# Patient Record
Sex: Female | Born: 1950 | Race: Asian | Hispanic: No | Smoking: Never smoker
Health system: Southern US, Community
[De-identification: ages and names within clinical notes are randomized; demographics above are authoritative.]

## PROBLEM LIST (undated history)

## (undated) DIAGNOSIS — M542 Cervicalgia: Secondary | ICD-10-CM

## (undated) DIAGNOSIS — R5383 Other fatigue: Secondary | ICD-10-CM

## (undated) DIAGNOSIS — R413 Other amnesia: Secondary | ICD-10-CM

## (undated) DIAGNOSIS — I10 Essential (primary) hypertension: Secondary | ICD-10-CM

## (undated) DIAGNOSIS — R51 Headache: Secondary | ICD-10-CM

## (undated) DIAGNOSIS — M7918 Myalgia, other site: Secondary | ICD-10-CM

## (undated) DIAGNOSIS — R519 Headache, unspecified: Secondary | ICD-10-CM

## (undated) HISTORY — DX: Myalgia, other site: M79.18

## (undated) HISTORY — DX: Other fatigue: R53.83

## (undated) HISTORY — DX: Other amnesia: R41.3

## (undated) HISTORY — DX: Essential (primary) hypertension: I10

## (undated) HISTORY — DX: Headache: R51

## (undated) HISTORY — DX: Cervicalgia: M54.2

## (undated) HISTORY — PX: OTHER SURGICAL HISTORY: SHX169

## (undated) HISTORY — DX: Headache, unspecified: R51.9

---

## 2002-03-04 ENCOUNTER — Inpatient Hospital Stay (HOSPITAL_COMMUNITY): Admission: AD | Admit: 2002-03-04 | Discharge: 2002-03-04 | Payer: Self-pay | Admitting: *Deleted

## 2002-03-07 ENCOUNTER — Encounter: Admission: RE | Admit: 2002-03-07 | Discharge: 2002-03-07 | Payer: Self-pay | Admitting: Obstetrics and Gynecology

## 2002-04-30 ENCOUNTER — Encounter: Admission: RE | Admit: 2002-04-30 | Discharge: 2002-04-30 | Payer: Self-pay | Admitting: *Deleted

## 2002-08-19 ENCOUNTER — Emergency Department (HOSPITAL_COMMUNITY): Admission: EM | Admit: 2002-08-19 | Discharge: 2002-08-19 | Payer: Self-pay | Admitting: Emergency Medicine

## 2004-06-25 ENCOUNTER — Ambulatory Visit (HOSPITAL_BASED_OUTPATIENT_CLINIC_OR_DEPARTMENT_OTHER): Admission: RE | Admit: 2004-06-25 | Discharge: 2004-06-25 | Payer: Self-pay | Admitting: Otolaryngology

## 2009-04-26 ENCOUNTER — Emergency Department (HOSPITAL_COMMUNITY): Admission: EM | Admit: 2009-04-26 | Discharge: 2009-04-26 | Payer: Self-pay | Admitting: Emergency Medicine

## 2011-05-13 NOTE — Op Note (Signed)
NAME:  Jo Jenkins                              ACCOUNT NO.:  [id number]   MEDICAL RECORD NO.:  [id number]                   PATIENT TYPE:  AMB   LOCATION:  DSC                                  FACILITY:  [facility]   PHYSICIAN:  Christopher E. Ezzard Standing, M.D.         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   OPERATION:  Left medial graft tympanoplasty.   SURGEON:  Kristine Garbe. Ezzard Standing, M.D.   ANESTHESIA:  General endotracheal.   COMPLICATIONS:  None.   BRIEF CLINICAL NOTE:  [name] is a 60 year old [place] lady who  presented with decreased hearing and has a large central 30-35% TM  perforation.  It is dry without drainage.  She has a mild conductive hearing  loss on hearing screen with the tuning forks.  She is taken to the operating  room at this time for a left tympanoplasty.   DESCRIPTION OF PROCEDURE:  After adequate endotracheal anesthesia, the  patient received 1 g Ancef IV preoperatively.  The ear was prepped with  Betadine solution and draped out with sterile towels.  The ear canal was  then injected with Xylocaine with epinephrine for hemostasis and the  postauricular area was likewise injected.  A posterior-based tympanomeatal  flap was elevated down to the annulus.  Cotton pledgets soaked in adrenalin  were then placed for hemostasis.  During this time a temporalis fascia graft  was harvested from a postauricular incision and set aside to dry.  The  postauricular incision was closed with 3-0 chromic suture subcutaneously and  4-0 nylon through the skin.  Next attention was carried back to the ear  canal.  The annulus was elevated.  The middle ear space was entered.  There  was  some thickened middle ear mucosa inferiorly but no drainage.  The edges  of the perforation were freshened up prior to elevating the tympanomeatal  flap with cup forceps and a straight pick.  After elevating the  tympanomeatal flap, the incus and stapes could be visualized and the incus  and stapes were mobile.  There was no significant middle ear disease noted.  The temporalis fascia graft was cut to appropriate size, was placed medial  to the tympanomeatal flap and medial to the perforation.  The middle ear  space was then packed with Gelfoam soaked in colimycin.  The tympanomeatal  flap was brought back down along with the fascial graft, and the fascial  graft covered the entire perforation.  The ear canal was then packed with  Gelfoam soaked in colimycin.  This completed the procedure.  A cotton ball  was placed in the ear canal, followed by a  mastoid dressing.  The patient  was awoken from anesthesia and transferred to the recovery room postop doing  well.   DISPOSITION:  [name] is discharged home later this morning on Tylenol and  Tylenol No. 3 p.r.n. pain, Keflex 500 mg b.i.d. for four days.  We will have  her follow up in my office in one week for recheck and to remove  postauricular sutures.  She is instructed to remove the mastoid dressing in  the morning.                                              Kristine Garbe. Ezzard Standing, M.D.   CEN/MEDQ  D:  06/25/2004  T:  06/26/2004  Job:  [id number]

## 2014-06-19 ENCOUNTER — Emergency Department (HOSPITAL_COMMUNITY)
Admission: EM | Admit: 2014-06-19 | Discharge: 2014-06-19 | Disposition: A | Discharge status: Home / Self Care | Payer: No Typology Code available for payment source | Attending: Emergency Medicine | Admitting: Emergency Medicine

## 2014-06-19 ENCOUNTER — Ambulatory Visit: Payer: Self-pay

## 2014-06-19 ENCOUNTER — Ambulatory Visit (INDEPENDENT_AMBULATORY_CARE_PROVIDER_SITE_OTHER): Payer: Self-pay | Admitting: Family Medicine

## 2014-06-19 ENCOUNTER — Encounter (HOSPITAL_COMMUNITY): Payer: Self-pay | Admitting: Emergency Medicine

## 2014-06-19 DIAGNOSIS — M79604 Pain in right leg: Secondary | ICD-10-CM

## 2014-06-19 DIAGNOSIS — M79609 Pain in unspecified limb: Secondary | ICD-10-CM | POA: Insufficient documentation

## 2014-06-19 DIAGNOSIS — S8011XA Contusion of right lower leg, initial encounter: Secondary | ICD-10-CM

## 2014-06-19 DIAGNOSIS — S20219S Contusion of unspecified front wall of thorax, sequela: Secondary | ICD-10-CM

## 2014-06-19 DIAGNOSIS — S8010XA Contusion of unspecified lower leg, initial encounter: Secondary | ICD-10-CM

## 2014-06-19 DIAGNOSIS — IMO0002 Reserved for concepts with insufficient information to code with codable children: Secondary | ICD-10-CM

## 2014-06-19 LAB — I-STAT CHEM 8, ED
BUN: 13 mg/dL (ref 6–23)
CHLORIDE: 101 meq/L (ref 96–112)
Calcium, Ion: 1.19 mmol/L (ref 1.13–1.30)
Creatinine, Ser: 0.6 mg/dL (ref 0.50–1.10)
GLUCOSE: 118 mg/dL — AB (ref 70–99)
HEMATOCRIT: 41 % (ref 36.0–46.0)
Hemoglobin: 13.9 g/dL (ref 12.0–15.0)
POTASSIUM: 3.6 meq/L — AB (ref 3.7–5.3)
Sodium: 143 mEq/L (ref 137–147)
TCO2: 25 mmol/L (ref 0–100)

## 2014-06-19 MED ORDER — HYDROCODONE-ACETAMINOPHEN 5-325 MG PO TABS: 1.0000 | ORAL_TABLET | Freq: Four times a day (QID) | ORAL | Status: DC | PRN

## 2014-06-19 MED ORDER — ENOXAPARIN SODIUM 80 MG/0.8ML ~~LOC~~ SOLN
1.0000 mg/kg | Freq: Once | SUBCUTANEOUS | Status: AC
  Administered 2014-06-19: 65 mg via SUBCUTANEOUS
  Filled 2014-06-19: qty 0.8

## 2014-06-19 MED ORDER — ENOXAPARIN SODIUM 100 MG/ML ~~LOC~~ SOLN
1.0000 mg/kg | Freq: Once | SUBCUTANEOUS | Status: DC
  Filled 2014-06-19 (×2): qty 1

## 2014-06-19 MED ORDER — HYDROCODONE-ACETAMINOPHEN 5-325 MG PO TABS
1.0000 | ORAL_TABLET | Freq: Once | ORAL | Status: AC
  Administered 2014-06-19: 1 via ORAL
  Filled 2014-06-19: qty 1
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
## 2014-06-19 NOTE — ED Notes (Signed)
Pt A&OX4, wheeled out of ED via wheelchair, NAD. 

## 2014-06-19 NOTE — Patient Instructions (Signed)
Go to the [facility] emergency room. I spoke to the emergency room physician who knows that she is coming. I am concerned she could have a compartment syndrome and they need to do some further testing to determine whether this is the case or not.

## 2014-06-19 NOTE — ED Notes (Signed)
Dr. Plunkett at bedside.  
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
## 2014-06-20 ENCOUNTER — Encounter (HOSPITAL_COMMUNITY): Payer: Self-pay | Admitting: Emergency Medicine

## 2014-06-20 ENCOUNTER — Emergency Department (HOSPITAL_COMMUNITY)
Admission: EM | Admit: 2014-06-20 | Discharge: 2014-06-20 | Disposition: A | Discharge status: Home / Self Care | Payer: No Typology Code available for payment source | Attending: Emergency Medicine | Admitting: Emergency Medicine

## 2014-06-20 ENCOUNTER — Ambulatory Visit (HOSPITAL_COMMUNITY)
Admission: RE | Admit: 2014-06-20 | Discharge: 2014-06-20 | Disposition: A | Discharge status: Home / Self Care | Payer: No Typology Code available for payment source | Source: Ambulatory Visit

## 2014-06-20 DIAGNOSIS — T50905A Adverse effect of unspecified drugs, medicaments and biological substances, initial encounter: Secondary | ICD-10-CM

## 2014-06-20 DIAGNOSIS — M79609 Pain in unspecified limb: Secondary | ICD-10-CM

## 2014-06-20 DIAGNOSIS — T4275XA Adverse effect of unspecified antiepileptic and sedative-hypnotic drugs, initial encounter: Secondary | ICD-10-CM | POA: Insufficient documentation

## 2014-06-20 DIAGNOSIS — M7989 Other specified soft tissue disorders: Secondary | ICD-10-CM

## 2014-06-20 DIAGNOSIS — R42 Dizziness and giddiness: Secondary | ICD-10-CM | POA: Insufficient documentation

## 2014-06-20 DIAGNOSIS — M79604 Pain in right leg: Secondary | ICD-10-CM

## 2014-06-20 DIAGNOSIS — R110 Nausea: Secondary | ICD-10-CM | POA: Insufficient documentation

## 2014-06-20 DIAGNOSIS — Z87828 Personal history of other (healed) physical injury and trauma: Secondary | ICD-10-CM | POA: Insufficient documentation

## 2014-06-20 MED ORDER — TRAMADOL HCL 50 MG PO TABS: 50.0000 mg | ORAL_TABLET | Freq: Four times a day (QID) | ORAL | Status: DC | PRN

## 2014-06-20 MED ORDER — ONDANSETRON 4 MG PO TBDP
4.0000 mg | ORAL_TABLET | Freq: Once | ORAL | Status: AC
  Administered 2014-06-20: 4 mg via ORAL
  Filled 2014-06-20: qty 1

## 2014-06-20 MED ORDER — DICLOFENAC SODIUM 1 % TD GEL: 2.0000 g | Freq: Four times a day (QID) | TRANSDERMAL | Status: DC
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
## 2014-06-20 NOTE — Progress Notes (Signed)
Right lower extremity venous duplex completed.  Right:  No evidence of DVT, superficial thrombosis, or Baker's cyst.  Left:  Negative for DVT in the common femoral vein.  

## 2014-06-20 NOTE — ED Notes (Signed)
Pt reported needing a less stronger pain med.than Vicodin . Pt has low tol. To pain meds.
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
## 2014-06-20 NOTE — Discharge Instructions (Signed)
Read the information below.  Use the prescribed medication as directed.  Please discuss all new medications with your pharmacist.  You may return to the Emergency Department at any time for worsening condition or any new symptoms that concern you.   If you develop uncontrolled pain, weakness or numbness of the extremity, severe discoloration of the skin, or you are unable to walk, return to the ER for a recheck.    ° ° °Musculoskeletal Pain °Musculoskeletal pain is muscle and boney aches and pains. These pains can occur in any part of the body. Your caregiver may treat you without knowing the cause of the pain. They may treat you if blood or urine tests, X-rays, and other tests were normal.  °CAUSES °There is often not a definite cause or reason for these pains. These pains may be caused by a type of germ (virus). The discomfort may also come from overuse. Overuse includes working out too hard when your body is not fit. Boney aches also come from weather changes. Bone is sensitive to atmospheric pressure changes. °HOME CARE INSTRUCTIONS  °· Ask when your test results will be ready. Make sure you get your test results. °· Only take over-the-counter or prescription medicines for pain, discomfort, or fever as directed by your caregiver. If you were given medications for your condition, do not drive, operate machinery or power tools, or sign legal documents for 24 hours. Do not drink alcohol. Do not take sleeping pills or other medications that may interfere with treatment. °· Continue all activities unless the activities cause more pain. When the pain lessens, slowly resume normal activities. Gradually increase the intensity and duration of the activities or exercise. °· During periods of severe pain, bed rest may be helpful. Lay or sit in any position that is comfortable. °· Putting ice on the injured area. °¨ Put ice in a bag. °¨ Place a towel between your skin and the bag. °¨ Leave the ice on for 15 to 20 minutes, 3  to 4 times a day. °· Follow up with your caregiver for continued problems and no reason can be found for the pain. If the pain becomes worse or does not go away, it may be necessary to repeat tests or do additional testing. Your caregiver may need to look further for a possible cause. °SEEK IMMEDIATE MEDICAL CARE IF: °· You have pain that is getting worse and is not relieved by medications. °· You develop chest pain that is associated with shortness or breath, sweating, feeling sick to your stomach (nauseous), or throw up (vomit). °· Your pain becomes localized to the abdomen. °· You develop any new symptoms that seem different or that concern you. °MAKE SURE YOU:  °· Understand these instructions. °· Will watch your condition. °· Will get help right away if you are not doing well or get worse. °Document Released: 12/12/2005 Document Revised: 03/05/2012 Document Reviewed: 08/16/2013 °ExitCare® Patient Information ©2015 [employer]. This information is not intended to replace advice given to you by your health care provider. Make sure you discuss any questions you have with your health care provider. ° °

## 2014-06-29 NOTE — ED Provider Notes (Signed)
Medical screening examination/treatment/procedure(s) were performed by non-physician practitioner and as supervising physician I was immediately available for consultation/collaboration.   EKG Interpretation None       Donnetta HutchingBrian Cook, MD 06/29/14 2028

## 2015-12-24 ENCOUNTER — Encounter: Payer: Self-pay | Admitting: Internal Medicine

## 2015-12-24 ENCOUNTER — Other Ambulatory Visit (INDEPENDENT_AMBULATORY_CARE_PROVIDER_SITE_OTHER): Payer: BLUE CROSS/BLUE SHIELD

## 2015-12-24 ENCOUNTER — Ambulatory Visit (INDEPENDENT_AMBULATORY_CARE_PROVIDER_SITE_OTHER): Payer: BLUE CROSS/BLUE SHIELD | Admitting: Internal Medicine

## 2015-12-24 VITALS — BP 120/80 | HR 62 | Temp 98.3°F | Resp 16 | Wt 143.0 lb

## 2015-12-24 DIAGNOSIS — Z139 Encounter for screening, unspecified: Secondary | ICD-10-CM

## 2015-12-24 DIAGNOSIS — I10 Essential (primary) hypertension: Secondary | ICD-10-CM | POA: Diagnosis not present

## 2015-12-24 DIAGNOSIS — Z13820 Encounter for screening for osteoporosis: Secondary | ICD-10-CM

## 2015-12-24 DIAGNOSIS — H918X1 Other specified hearing loss, right ear: Secondary | ICD-10-CM

## 2015-12-24 DIAGNOSIS — Z1231 Encounter for screening mammogram for malignant neoplasm of breast: Secondary | ICD-10-CM

## 2015-12-24 DIAGNOSIS — H9191 Unspecified hearing loss, right ear: Secondary | ICD-10-CM | POA: Diagnosis not present

## 2015-12-24 DIAGNOSIS — M25551 Pain in right hip: Secondary | ICD-10-CM

## 2015-12-24 DIAGNOSIS — S0990XS Unspecified injury of head, sequela: Secondary | ICD-10-CM

## 2015-12-24 LAB — COMPREHENSIVE METABOLIC PANEL
ALK PHOS: 69 U/L (ref 39–117)
ALT: 19 U/L (ref 0–35)
AST: 17 U/L (ref 0–37)
Albumin: 4.3 g/dL (ref 3.5–5.2)
BUN: 16 mg/dL (ref 6–23)
CO2: 29 mEq/L (ref 19–32)
Calcium: 9.5 mg/dL (ref 8.4–10.5)
Chloride: 105 mEq/L (ref 96–112)
Creatinine, Ser: 0.54 mg/dL (ref 0.40–1.20)
GFR: 121.56 mL/min (ref >60.00)
Glucose, Bld: 98 mg/dL (ref 70–99)
Potassium: 3.8 mEq/L (ref 3.5–5.1)
SODIUM: 143 meq/L (ref 135–145)
TOTAL PROTEIN: 7.3 g/dL (ref 6.0–8.3)
Total Bilirubin: 0.8 mg/dL (ref 0.2–1.2)

## 2015-12-24 LAB — CBC WITH DIFFERENTIAL/PLATELET
BASOS ABS: 0 10*3/uL (ref 0.0–0.1)
Basophils Relative: 0.5 % (ref 0.0–3.0)
EOS PCT: 1.8 % (ref 0.0–5.0)
Eosinophils Absolute: 0.1 10*3/uL (ref 0.0–0.7)
HCT: 41.1 % (ref 36.0–46.0)
Hemoglobin: 13.9 g/dL (ref 12.0–15.0)
LYMPHS ABS: 2.4 10*3/uL (ref 0.7–4.0)
Lymphocytes Relative: 41.7 % (ref 12.0–46.0)
MCHC: 33.8 g/dL (ref 30.0–36.0)
MCV: 88.9 fl (ref 78.0–100.0)
MONO ABS: 0.2 10*3/uL (ref 0.1–1.0)
Monocytes Relative: 4.2 % (ref 3.0–12.0)
Neutro Abs: 2.9 10*3/uL (ref 1.4–7.7)
Neutrophils Relative %: 51.8 % (ref 43.0–77.0)
Platelets: 228 10*3/uL (ref 150.0–400.0)
RBC: 4.62 Mil/uL (ref 3.87–5.11)
RDW: 12.1 % (ref 11.5–15.5)
WBC: 5.7 10*3/uL (ref 4.0–10.5)

## 2015-12-24 LAB — LIPID PANEL
Cholesterol: 225 mg/dL — ABNORMAL HIGH (ref 0–200)
HDL: 48.1 mg/dL (ref >39.00)
LDL Cholesterol: 140 mg/dL — ABNORMAL HIGH (ref 0–99)
NONHDL: 177.06
Total CHOL/HDL Ratio: 5
Triglycerides: 185 mg/dL — ABNORMAL HIGH (ref 0.0–149.0)
VLDL: 37 mg/dL (ref 0.0–40.0)

## 2015-12-24 LAB — HEMOGLOBIN A1C: Hgb A1c MFr Bld: 5.8 % (ref 4.6–6.5)

## 2015-12-24 LAB — TSH: TSH: 3.16 u[IU]/mL (ref 0.35–4.50)

## 2015-12-24 NOTE — Assessment & Plan Note (Signed)
Well-controlled Check CMP Continue current medications at current doses

## 2015-12-24 NOTE — Patient Instructions (Addendum)
[facility] [address] [place] [id number] Across the street from [facility] [phone]  [facility] [address]  [place], [place]  [phone]   Test(s) ordered today. Your results will be released to MyChart (or called to you) after review, usually within 72hours after test completion. If any changes need to be made, you will be notified at that same time.  All other Health Maintenance issues reviewed.   All recommended immunizations and age-appropriate screenings are up-to-date/discussed.  No immunizations administered today.   Medications reviewed and updated.  No changes recommended at this time.  Your prescription(s) have been submitted to your pharmacy. Please take as directed and contact our office if you believe you are having problem(s) with the medication(s).  A referral was ordered for ENT.  A mammogram and bone density scan was ordered.  We will call or email you regarding this.   Please followup annually

## 2015-12-24 NOTE — Progress Notes (Signed)
Subjective:    Patient ID: Jo Jenkins, female    DOB: 06/06/1951, 64 y.o.   MRN: [id number]  HPI She is here to establish with a new pcp.  Right hip pain;  She has had pain for a few months in the right hip.  She feels it most when she sleep.  She has no pain with walking.  She has not taken any medication for it. The pain is not severe.    Hypertension: She is taking her medication daily. She is compliant with a low sodium diet.  She denies chest pain, palpitations, edema, shortness of breath and regular headaches. She is exercising regularly.  She does not monitor her blood pressure at home.    She has not had blood work in two years.  [place] and one polyp was removed.  She has not had a mammogram.  She has not had a bone density.   Medications and allergies reviewed with patient and updated.  Patient Active Problem List   Diagnosis Date Noted  . Essential hypertension, benign 12/24/2015  . Right hip pain 12/24/2015     Medication List       hydrochlorothiazide 12.5 MG capsule  Commonly known as:  MICROZIDE  Take 12.5 mg by mouth daily.     lisinopril 2.5 MG tablet  Commonly known as:  PRINIVIL,ZESTRIL  Take 2.5 mg by mouth daily.         Past Medical History  Diagnosis Date  . Hypertension     Past Surgical History  Procedure Laterality Date  . No surgery      Social History   Social History  . Marital Status: Single    Spouse Name: N/A  . Number of Children: N/A  . Years of Education: N/A   Social History Main Topics  . Smoking status: Never Smoker   . Smokeless tobacco: None  . Alcohol Use: No  . Drug Use: None  . Sexual Activity: Not Asked   Other Topics Concern  . None   Social History Narrative   Exercises on treadmill    Review of Systems  Constitutional: Negative for fever, appetite change and fatigue.  HENT: Negative for congestion and sore throat.   Respiratory: Negative for cough, shortness of breath  and wheezing.   Cardiovascular: Negative for chest pain, palpitations and leg swelling.  Gastrointestinal: Positive for constipation (occasional). Negative for nausea, abdominal pain and diarrhea.  Genitourinary: Negative for dysuria.  Musculoskeletal: Positive for arthralgias (right hip pain). Negative for back pain.  Neurological: Negative for dizziness, light-headedness and headaches.  Psychiatric/Behavioral: Negative for sleep disturbance and dysphoric mood. The patient is not nervous/anxious.        Objective:   Filed Vitals:   12/24/15 0900  BP: 120/80  Pulse: 62  Temp: 98.3 F (36.8 C)  Resp: 16   Filed Weights   12/24/15 0900  Weight: 143 lb (64.864 kg)   Body mass index is 27.03 kg/(m^2).   Physical Exam  Constitutional: She appears well-developed and well-nourished. No distress.  HENT:  Head: Normocephalic and atraumatic.  Right Ear: External ear normal.  Left Ear: External ear normal.  Mouth/Throat: Oropharynx is clear and moist.  Neck: Neck supple. No tracheal deviation present. No thyromegaly present.  No carotid bruit  Cardiovascular: Normal rate, regular rhythm, normal heart sounds and intact distal pulses.   No murmur heard. Pulmonary/Chest: Breath sounds normal. No respiratory distress. She has no wheezes. She  has no rales.  Abdominal: Soft. She exhibits no distension. There is no tenderness.  Musculoskeletal: She exhibits no edema.  Lymphadenopathy:    She has no cervical adenopathy.  Skin: Skin is warm and dry. She is not diaphoretic.  Psychiatric: She has a normal mood and affect. Her behavior is normal.          Assessment & Plan:   Screening blood work ordered Will refer for a mammogram DEXA ordered Continue regular exercise Has a healthy diet  Regular hip pain Declined referral to physical therapy or sports specialist Declined prescription medication Can take Advil as needed  Decreased hearing in right ear Related to trauma she  had years ago There is visible blood near the tympanic membrane-no recent trauma She is interested in seeing an ENT with the blood seen today I think it is a good idea-referred today   See problem list for further assessment and plan  Follow-up annually, sooner if needed

## 2015-12-28 LAB — VITAMIN D 1,25 DIHYDROXY
Vitamin D 1, 25 (OH)2 Total: 63 pg/mL (ref 18–72)
Vitamin D3 1, 25 (OH)2: 63 pg/mL

## 2016-01-28 ENCOUNTER — Other Ambulatory Visit: Payer: Self-pay | Admitting: Internal Medicine

## 2016-01-28 DIAGNOSIS — E2839 Other primary ovarian failure: Secondary | ICD-10-CM

## 2016-09-09 ENCOUNTER — Ambulatory Visit (INDEPENDENT_AMBULATORY_CARE_PROVIDER_SITE_OTHER): Payer: BLUE CROSS/BLUE SHIELD

## 2016-09-09 ENCOUNTER — Telehealth: Payer: Self-pay

## 2016-09-09 DIAGNOSIS — Z23 Encounter for immunization: Secondary | ICD-10-CM

## 2016-09-09 NOTE — Addendum Note (Signed)
Addended by: Alonna MiniumWILLIAMS, TAMARA P on: 09/09/2016 12:44 PM   Modules accepted: Orders

## 2016-09-09 NOTE — Telephone Encounter (Addendum)
Patient came in for flu and pneuonia vaccine today---patient was advised that I can give pneuonia vaccine but since patient is not 65 years old and does not have condition on problem list that warrants pneumonia vaccine---insurance may not cover cost of vaccine ---patient stated no problem to cover bill if insurance does not pay---I have given pneumonia and flu vaccine----I gave pneumo 23 due to patient request, he wanted the vaccine with the most coverage---I did explain that usually prevnar 13 is first given----but he only wanted pneumo 23----ok to give pneumo 23 first per greg calone---no harm if 23 is given first for patient, but still insurance may not cover cost---patient advised

## 2016-11-16 ENCOUNTER — Ambulatory Visit (INDEPENDENT_AMBULATORY_CARE_PROVIDER_SITE_OTHER): Payer: BLUE CROSS/BLUE SHIELD | Admitting: Internal Medicine

## 2016-11-16 ENCOUNTER — Other Ambulatory Visit (INDEPENDENT_AMBULATORY_CARE_PROVIDER_SITE_OTHER): Payer: BLUE CROSS/BLUE SHIELD

## 2016-11-16 ENCOUNTER — Encounter: Payer: Self-pay | Admitting: Internal Medicine

## 2016-11-16 VITALS — BP 122/60 | HR 58 | Temp 98.2°F | Resp 14 | Ht 61.0 in | Wt 143.0 lb

## 2016-11-16 DIAGNOSIS — K219 Gastro-esophageal reflux disease without esophagitis: Secondary | ICD-10-CM

## 2016-11-16 LAB — COMPREHENSIVE METABOLIC PANEL
ALT: 17 U/L (ref 0–35)
AST: 15 U/L (ref 0–37)
Albumin: 4.6 g/dL (ref 3.5–5.2)
Alkaline Phosphatase: 61 U/L (ref 39–117)
BUN: 10 mg/dL (ref 6–23)
CHLORIDE: 101 meq/L (ref 96–112)
CO2: 30 mEq/L (ref 19–32)
Calcium: 9.8 mg/dL (ref 8.4–10.5)
Creatinine, Ser: 0.58 mg/dL (ref 0.40–1.20)
GFR: 111.61 mL/min (ref >60.00)
GLUCOSE: 93 mg/dL (ref 70–99)
POTASSIUM: 4.2 meq/L (ref 3.5–5.1)
Sodium: 140 mEq/L (ref 135–145)
Total Bilirubin: 0.7 mg/dL (ref 0.2–1.2)
Total Protein: 7.6 g/dL (ref 6.0–8.3)

## 2016-11-16 LAB — CBC
HEMATOCRIT: 40.4 % (ref 36.0–46.0)
HEMOGLOBIN: 14.1 g/dL (ref 12.0–15.0)
MCHC: 34.8 g/dL (ref 30.0–36.0)
MCV: 86.7 fl (ref 78.0–100.0)
Platelets: 241 10*3/uL (ref 150.0–400.0)
RBC: 4.66 Mil/uL (ref 3.87–5.11)
RDW: 12.4 % (ref 11.5–15.5)
WBC: 6.8 10*3/uL (ref 4.0–10.5)

## 2016-11-16 MED ORDER — LISINOPRIL 2.5 MG PO TABS: 2.5000 mg | ORAL_TABLET | Freq: Every day | ORAL | 1 refills | Status: DC

## 2016-11-16 MED ORDER — OMEPRAZOLE 40 MG PO CPDR: 40.0000 mg | DELAYED_RELEASE_CAPSULE | Freq: Every day | ORAL | 3 refills | Status: DC

## 2016-11-16 MED ORDER — HYDROCHLOROTHIAZIDE 12.5 MG PO CAPS: 12.5000 mg | ORAL_CAPSULE | Freq: Every day | ORAL | 1 refills | Status: DC

## 2016-11-16 NOTE — Progress Notes (Signed)
Pre visit review using our clinic review tool, if applicable. No additional management support is needed unless otherwise documented below in the visit note. 

## 2016-11-16 NOTE — Patient Instructions (Signed)
We have sent in medicine for the heart burn called omeprazole. She can take 1 pill daily to prevent the heart burn pain.   We are checking blood work today.   We will get you in with the stomach specialist to get checked.    Food Choices for Gastroesophageal Reflux Disease, Adult When you have gastroesophageal reflux disease (GERD), the foods you eat and your eating habits are very important. Choosing the right foods can help ease your discomfort. What guidelines do I need to follow?  Choose fruits, vegetables, whole grains, and low-fat dairy products.  Choose low-fat meat, fish, and poultry.  Limit fats such as oils, salad dressings, butter, nuts, and avocado.  Keep a food diary. This helps you identify foods that cause symptoms.  Avoid foods that cause symptoms. These may be different for everyone.  Eat small meals often instead of 3 large meals a day.  Eat your meals slowly, in a place where you are relaxed.  Limit fried foods.  Cook foods using methods other than frying.  Avoid drinking alcohol.  Avoid drinking large amounts of liquids with your meals.  Avoid bending over or lying down until 2-3 hours after eating. What foods are not recommended? These are some foods and drinks that may make your symptoms worse: Vegetables  Tomatoes. Tomato juice. Tomato and spaghetti sauce. Chili peppers. Onion and garlic. Horseradish. Fruits  Oranges, grapefruit, and lemon (fruit and juice). Meats  High-fat meats, fish, and poultry. This includes hot dogs, ribs, ham, sausage, salami, and bacon. Dairy  Whole milk and chocolate milk. Sour cream. Cream. Butter. Ice cream. Cream cheese. Drinks  Coffee and tea. Bubbly (carbonated) drinks or energy drinks. Condiments  Hot sauce. Barbecue sauce. Sweets/Desserts  Chocolate and cocoa. Donuts. Peppermint and spearmint. Fats and Oils  High-fat foods. This includes [place] fries and potato chips. Other  Vinegar. Strong spices. This  includes black pepper, white pepper, red pepper, cayenne, curry powder, cloves, ginger, and chili powder. The items listed above may not be a complete list of foods and drinks to avoid. Contact your dietitian for more information.  This information is not intended to replace advice given to you by your health care provider. Make sure you discuss any questions you have with your health care provider. Document Released: 06/12/2012 Document Revised: 05/19/2016 Document Reviewed: 10/16/2013 Elsevier Interactive Patient Education  2017 [employer]

## 2016-11-16 NOTE — Assessment & Plan Note (Signed)
Rx for omeprazole and referral to GI per their request.

## 2016-11-16 NOTE — Progress Notes (Signed)
   Subjective:    Patient ID: Jo Jenkins, female    DOB: 11/27/1951, 65 y.o.   MRN: [id number]  HPI The patient is a 65 YO female coming in for GERD symptoms. She does not speak english well so her husband translates for her. She is having these symptoms off and on for 2 years. She gets heart burn randomly and relieved within 10 minutes. She has not taken any medicines for this yet. No weight loss over that time. She has not noticed food triggers. Does not eat close to bedtime.   Review of Systems  Constitutional: Negative.   Respiratory: Negative for cough, chest tightness and shortness of breath.   Cardiovascular: Negative for chest pain, palpitations and leg swelling.  Gastrointestinal: Negative for abdominal distention, abdominal pain, constipation, diarrhea, nausea and vomiting.       GERD  Musculoskeletal: Negative.   Skin: Negative.       Objective:   Physical Exam  Constitutional: She is oriented to person, place, and time. She appears well-developed and well-nourished.  HENT:  Head: Normocephalic and atraumatic.  Eyes: EOM are normal.  Neck: Normal range of motion.  Cardiovascular: Normal rate and regular rhythm.   Pulmonary/Chest: Effort normal and breath sounds normal. No respiratory distress. She has no wheezes. She has no rales.  Abdominal: Soft. Bowel sounds are normal. She exhibits no distension. There is no tenderness. There is no rebound.  Neurological: She is alert and oriented to person, place, and time. Coordination normal.  Skin: Skin is warm and dry.   Vitals:   11/16/16 1117  BP: 122/60  Pulse: (!) 58  Resp: 14  Temp: 98.2 F (36.8 C)  TempSrc: Oral  SpO2: 97%  Weight: 143 lb (64.9 kg)  Height: 5\' 1"  (1.549 m)      Assessment & Plan:

## 2017-01-05 ENCOUNTER — Encounter: Payer: Self-pay | Admitting: Internal Medicine

## 2017-01-27 ENCOUNTER — Encounter: Payer: Self-pay | Admitting: Internal Medicine

## 2017-11-20 ENCOUNTER — Encounter: Payer: Self-pay | Admitting: Internal Medicine

## 2017-11-20 ENCOUNTER — Ambulatory Visit (INDEPENDENT_AMBULATORY_CARE_PROVIDER_SITE_OTHER): Payer: BLUE CROSS/BLUE SHIELD | Admitting: Internal Medicine

## 2017-11-20 ENCOUNTER — Ambulatory Visit (INDEPENDENT_AMBULATORY_CARE_PROVIDER_SITE_OTHER)
Admission: RE | Admit: 2017-11-20 | Discharge: 2017-11-20 | Disposition: A | Discharge status: Home / Self Care | Payer: BLUE CROSS/BLUE SHIELD | Source: Ambulatory Visit | Attending: Internal Medicine | Admitting: Internal Medicine

## 2017-11-20 VITALS — BP 124/70 | HR 68 | Temp 97.8°F | Resp 16 | Ht 61.0 in | Wt 143.0 lb

## 2017-11-20 DIAGNOSIS — K644 Residual hemorrhoidal skin tags: Secondary | ICD-10-CM | POA: Insufficient documentation

## 2017-11-20 DIAGNOSIS — K219 Gastro-esophageal reflux disease without esophagitis: Secondary | ICD-10-CM | POA: Diagnosis not present

## 2017-11-20 DIAGNOSIS — K5900 Constipation, unspecified: Secondary | ICD-10-CM | POA: Diagnosis not present

## 2017-11-20 DIAGNOSIS — Z0000 Encounter for general adult medical examination without abnormal findings: Secondary | ICD-10-CM | POA: Diagnosis not present

## 2017-11-20 DIAGNOSIS — Z1159 Encounter for screening for other viral diseases: Secondary | ICD-10-CM

## 2017-11-20 DIAGNOSIS — Z13820 Encounter for screening for osteoporosis: Secondary | ICD-10-CM

## 2017-11-20 DIAGNOSIS — I10 Essential (primary) hypertension: Secondary | ICD-10-CM

## 2017-11-20 DIAGNOSIS — E785 Hyperlipidemia, unspecified: Secondary | ICD-10-CM | POA: Insufficient documentation

## 2017-11-20 DIAGNOSIS — E7849 Other hyperlipidemia: Secondary | ICD-10-CM

## 2017-11-20 DIAGNOSIS — E2839 Other primary ovarian failure: Secondary | ICD-10-CM | POA: Diagnosis not present

## 2017-11-20 DIAGNOSIS — Z114 Encounter for screening for human immunodeficiency virus [HIV]: Secondary | ICD-10-CM

## 2017-11-20 DIAGNOSIS — R7303 Prediabetes: Secondary | ICD-10-CM | POA: Insufficient documentation

## 2017-11-20 MED ORDER — HYDROCORTISONE 2.5 % RE CREA: 1.0000 "application " | TOPICAL_CREAM | Freq: Two times a day (BID) | RECTAL | 3 refills | Status: DC

## 2017-11-20 NOTE — Assessment & Plan Note (Addendum)
Denies gerd daily, occasional indigestion Not on any medication, except occasional tums

## 2017-11-20 NOTE — Assessment & Plan Note (Signed)
BP well controlled Current regimen effective and well tolerated Continue current medications at current doses cmp  

## 2017-11-20 NOTE — Assessment & Plan Note (Signed)
Check lipid panel  Regular exercise and healthy diet encouraged  

## 2017-11-20 NOTE — Patient Instructions (Addendum)
Test(s) ordered today. Your results will be released to [place] (or called to you) after review, usually within 72hours after test completion. If any changes need to be made, you will be notified at that same time.  All other Health Maintenance issues reviewed.   All recommended immunizations and age-appropriate screenings are up-to-date or discussed.  No immunizations administered today.   Medications reviewed and updated.  Changes include trying a stool softener daily.  Try adding metamucil daily.  Try taking miralax daily.    Start taking 600 mg of calcium a day and 1000 units of vitamin D daily.  You can try taking B12 1000 mcg daily to see if it helps with your energy level.   Try a cream for your rectal itch/discomfort.    Your prescription(s) have been submitted to your pharmacy. Please take as directed and contact our office if you believe you are having problem(s) with the medication(s).  A bone density scan was ordered.   Please followup in 6 months    Health Maintenance, Female Adopting a healthy lifestyle and getting preventive care can go a long way to promote health and wellness. Talk with your health care provider about what schedule of regular examinations is right for you. This is a good chance for you to check in with your provider about disease prevention and staying healthy. In between checkups, there are plenty of things you can do on your own. Experts have done a lot of research about which lifestyle changes and preventive measures are most likely to keep you healthy. Ask your health care provider for more information. Weight and diet Eat a healthy diet  Be sure to include plenty of vegetables, fruits, low-fat dairy products, and lean protein.  Do not eat a lot of foods high in solid fats, added sugars, or salt.  Get regular exercise. This is one of the most important things you can do for your health. ? Most adults should exercise for at least 150 minutes each  week. The exercise should increase your heart rate and make you sweat (moderate-intensity exercise). ? Most adults should also do strengthening exercises at least twice a week. This is in addition to the moderate-intensity exercise.  Maintain a healthy weight  Body mass index (BMI) is a measurement that can be used to identify possible weight problems. It estimates body fat based on height and weight. Your health care provider can help determine your BMI and help you achieve or maintain a healthy weight.  For females 26 years of age and older: ? A BMI below 18.5 is considered underweight. ? A BMI of 18.5 to 24.9 is normal. ? A BMI of 25 to 29.9 is considered overweight. ? A BMI of 30 and above is considered obese.  Watch levels of cholesterol and blood lipids  You should start having your blood tested for lipids and cholesterol at 63 years of age, then have this test every 5 years.  You may need to have your cholesterol levels checked more often if: ? Your lipid or cholesterol levels are high. ? You are older than 64 years of age. ? You are at high risk for heart disease.  Cancer screening Lung Cancer  Lung cancer screening is recommended for adults 27-75 years old who are at high risk for lung cancer because of a history of smoking.  A yearly low-dose CT scan of the lungs is recommended for people who: ? Currently smoke. ? Have quit within the past 15 years. ?  Have at least a 30-pack-year history of smoking. A pack year is smoking an average of one pack of cigarettes a day for 1 year.  Yearly screening should continue until it has been 15 years since you quit.  Yearly screening should stop if you develop a health problem that would prevent you from having lung cancer treatment.  Breast Cancer  Practice breast self-awareness. This means understanding how your breasts normally appear and feel.  It also means doing regular breast self-exams. Let your health care provider know  about any changes, no matter how small.  If you are in your 20s or 30s, you should have a clinical breast exam (CBE) by a health care provider every 1-3 years as part of a regular health exam.  If you are 62 or older, have a CBE every year. Also consider having a breast X-ray (mammogram) every year.  If you have a family history of breast cancer, talk to your health care provider about genetic screening.  If you are at high risk for breast cancer, talk to your health care provider about having an MRI and a mammogram every year.  Breast cancer gene (BRCA) assessment is recommended for women who have family members with BRCA-related cancers. BRCA-related cancers include: ? Breast. ? Ovarian. ? Tubal. ? Peritoneal cancers.  Results of the assessment will determine the need for genetic counseling and BRCA1 and BRCA2 testing.  Cervical Cancer Your health care provider may recommend that you be screened regularly for cancer of the pelvic organs (ovaries, uterus, and vagina). This screening involves a pelvic examination, including checking for microscopic changes to the surface of your cervix (Pap test). You may be encouraged to have this screening done every 3 years, beginning at age 78.  For women ages 96-65, health care providers may recommend pelvic exams and Pap testing every 3 years, or they may recommend the Pap and pelvic exam, combined with testing for human papilloma virus (HPV), every 5 years. Some types of HPV increase your risk of cervical cancer. Testing for HPV may also be done on women of any age with unclear Pap test results.  Other health care providers may not recommend any screening for nonpregnant women who are considered low risk for pelvic cancer and who do not have symptoms. Ask your health care provider if a screening pelvic exam is right for you.  If you have had past treatment for cervical cancer or a condition that could lead to cancer, you need Pap tests and screening  for cancer for at least 20 years after your treatment. If Pap tests have been discontinued, your risk factors (such as having a new sexual partner) need to be reassessed to determine if screening should resume. Some women have medical problems that increase the chance of getting cervical cancer. In these cases, your health care provider may recommend more frequent screening and Pap tests.  Colorectal Cancer  This type of cancer can be detected and often prevented.  Routine colorectal cancer screening usually begins at 63 years of age and continues through 64 years of age.  Your health care provider may recommend screening at an earlier age if you have risk factors for colon cancer.  Your health care provider may also recommend using home test kits to check for hidden blood in the stool.  A small camera at the end of a tube can be used to examine your colon directly (sigmoidoscopy or colonoscopy). This is done to check for the earliest forms of colorectal  cancer.  Routine screening usually begins at age 17.  Direct examination of the colon should be repeated every 5-10 years through 64 years of age. However, you may need to be screened more often if early forms of precancerous polyps or small growths are found.  Skin Cancer  Check your skin from head to toe regularly.  Tell your health care provider about any new moles or changes in moles, especially if there is a change in a mole's shape or color.  Also tell your health care provider if you have a mole that is larger than the size of a pencil eraser.  Always use sunscreen. Apply sunscreen liberally and repeatedly throughout the day.  Protect yourself by wearing long sleeves, pants, a wide-brimmed hat, and sunglasses whenever you are outside.  Heart disease, diabetes, and high blood pressure  High blood pressure causes heart disease and increases the risk of stroke. High blood pressure is more likely to develop in: ? People who have  blood pressure in the high end of the normal range (130-139/85-89 mm Hg). ? People who are overweight or obese. ? People who are African American.  If you are 15-85 years of age, have your blood pressure checked every 3-5 years. If you are 40 years of age or older, have your blood pressure checked every year. You should have your blood pressure measured twice-once when you are at a hospital or clinic, and once when you are not at a hospital or clinic. Record the average of the two measurements. To check your blood pressure when you are not at a hospital or clinic, you can use: ? An automated blood pressure machine at a pharmacy. ? A home blood pressure monitor.  If you are between 27 years and 62 years old, ask your health care provider if you should take aspirin to prevent strokes.  Have regular diabetes screenings. This involves taking a blood sample to check your fasting blood sugar level. ? If you are at a normal weight and have a low risk for diabetes, have this test once every three years after 64 years of age. ? If you are overweight and have a high risk for diabetes, consider being tested at a younger age or more often. Preventing infection Hepatitis B  If you have a higher risk for hepatitis B, you should be screened for this virus. You are considered at high risk for hepatitis B if: ? You were born in a country where hepatitis B is common. Ask your health care provider which countries are considered high risk. ? Your parents were born in a high-risk country, and you have not been immunized against hepatitis B (hepatitis B vaccine). ? You have HIV or AIDS. ? You use needles to inject street drugs. ? You live with someone who has hepatitis B. ? You have had sex with someone who has hepatitis B. ? You get hemodialysis treatment. ? You take certain medicines for conditions, including cancer, organ transplantation, and autoimmune conditions.  Hepatitis C  Blood testing is recommended  for: ? Everyone born from [birth year] through 1963. ? Anyone with known risk factors for hepatitis C.  Sexually transmitted infections (STIs)  You should be screened for sexually transmitted infections (STIs) including gonorrhea and chlamydia if: ? You are sexually active and are younger than 64 years of age. ? You are older than 64 years of age and your health care provider tells you that you are at risk for this type of infection. ? Your  sexual activity has changed since you were last screened and you are at an increased risk for chlamydia or gonorrhea. Ask your health care provider if you are at risk.  If you do not have HIV, but are at risk, it may be recommended that you take a prescription medicine daily to prevent HIV infection. This is called pre-exposure prophylaxis (PrEP). You are considered at risk if: ? You are sexually active and do not regularly use condoms or know the HIV status of your partner(s). ? You take drugs by injection. ? You are sexually active with a partner who has HIV.  Talk with your health care provider about whether you are at high risk of being infected with HIV. If you choose to begin PrEP, you should first be tested for HIV. You should then be tested every 3 months for as long as you are taking PrEP. Pregnancy  If you are premenopausal and you may become pregnant, ask your health care provider about preconception counseling.  If you may become pregnant, take 400 to 800 micrograms (mcg) of folic acid every day.  If you want to prevent pregnancy, talk to your health care provider about birth control (contraception). Osteoporosis and menopause  Osteoporosis is a disease in which the bones lose minerals and strength with aging. This can result in serious bone fractures. Your risk for osteoporosis can be identified using a bone density scan.  If you are 65 years of age or older, or if you are at risk for osteoporosis and fractures, ask your health care provider if  you should be screened.  Ask your health care provider whether you should take a calcium or vitamin D supplement to lower your risk for osteoporosis.  Menopause may have certain physical symptoms and risks.  Hormone replacement therapy may reduce some of these symptoms and risks. Talk to your health care provider about whether hormone replacement therapy is right for you. Follow these instructions at home:  Schedule regular health, dental, and eye exams.  Stay current with your immunizations.  Do not use any tobacco products including cigarettes, chewing tobacco, or electronic cigarettes.  If you are pregnant, do not drink alcohol.  If you are breastfeeding, limit how much and how often you drink alcohol.  Limit alcohol intake to no more than 1 drink per day for nonpregnant women. One drink equals 12 ounces of beer, 5 ounces of wine, or 1 ounces of hard liquor.  Do not use street drugs.  Do not share needles.  Ask your health care provider for help if you need support or information about quitting drugs.  Tell your health care provider if you often feel depressed.  Tell your health care provider if you have ever been abused or do not feel safe at home. This information is not intended to replace advice given to you by your health care provider. Make sure you discuss any questions you have with your health care provider. Document Released: 06/27/2011 Document Revised: 05/19/2016 Document Reviewed: 09/15/2015 Elsevier Interactive Patient Education  [employer].

## 2017-11-20 NOTE — Assessment & Plan Note (Signed)
Check a1c Low sugar / carb diet Stressed regular exercise   

## 2017-11-20 NOTE — Assessment & Plan Note (Signed)
Taking miralax prn - advised to take daily Can try stool softeners or supplemental fiber Can refer to GI if needed, esp if anal pain persists

## 2017-11-20 NOTE — Assessment & Plan Note (Signed)
?   Also has hemorrhoids Discussed constipation treatment anusol cream bid prn Will refer to GI if no improvement

## 2017-11-20 NOTE — Progress Notes (Signed)
Subjective:    Patient ID: Jo Jenkins, female    DOB: 05/27/1951, 66 y.o.   MRN: [id number]  HPI She is here for a physical exam.   Her knee hurts sometimes and she feels it is related to her weight.  She is worried about having diabetes.   She sometimes has a head tremor.  Her oldest brother has parkinson's.  There is no hand tremor.    Her daughter thinks she had a silent stroke a long time ago.  She was not evaluated at that time.  She had two episodes.  She was not able to talk at the time, she was conscious and was tense.  Her memory has not been as good since then.  The episode lasted about 10 minutes. One side of her face has chronically been droopy.  [facility] showed no prior infarct.   Her anus is itchy or bulky at times.  It comes and goes.  She denies any bleeding.  She has constipation.  She is unsure if the symptoms in the anal area are related to constipation.  She takes miralax as needed and a laxative.    She had a normal [place] in [place].    Medications and allergies reviewed with patient and updated if appropriate.  Patient Active Problem List   Diagnosis Date Noted  . Prediabetes 11/20/2017  . Hyperlipidemia 11/20/2017  . GERD (gastroesophageal reflux disease) 11/16/2016  . Essential hypertension, benign 12/24/2015  . Right hip pain 12/24/2015  . 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 hydrochlorothiazide (MICROZIDE) 12.5 MG capsule Take 1 capsule (12.5 mg total) by mouth daily. 90 capsule 1  . lisinopril (PRINIVIL,ZESTRIL) 2.5 MG tablet Take 1 tablet (2.5 mg total) by mouth daily. (Patient not taking: Reported on 11/20/2017) 90 tablet 1  . omeprazole (PRILOSEC) 40 MG capsule Take 1 capsule (40 mg total) by mouth daily. (Patient not taking: Reported on 11/20/2017) 30 capsule 3   No current facility-administered medications on  file prior to visit.     Past Medical History:  Diagnosis Date  . Hypertension     Past Surgical History:  Procedure Laterality Date  . no surgery      Social History   Socioeconomic History  . Marital status: Single    Spouse name: None  . Number of children: None  . Years of education: None  . Highest education level: None  Social Needs  . Financial resource strain: None  . Food insecurity - worry: None  . Food insecurity - inability: None  . Transportation needs - medical: None  . Transportation needs - non-medical: None  Occupational History  . None  Tobacco Use  . Smoking status: Never Smoker  . Smokeless tobacco: Never Used  Substance and Sexual Activity  . Alcohol use: No  . Drug use: None  . Sexual activity: None  Other Topics Concern  . None  Social History Narrative   Exercises on treadmill    Family History  Problem Relation Age of Onset  . Diabetes Mother   . Heart disease Mother   . Stroke Mother   . Cancer Father     Review of Systems  Constitutional: Positive for fatigue. Negative for chills and fever.  Eyes: Negative for visual disturbance.  Respiratory: Positive for cough (dry cough). Negative for shortness of  breath and wheezing.   Cardiovascular: Positive for palpitations (with stress). Negative for chest pain and leg swelling.  Gastrointestinal: Positive for blood in stool. Negative for abdominal pain, constipation and diarrhea.  Genitourinary: Negative for dysuria.       Vaginal dryness  Musculoskeletal: Positive for arthralgias.  Skin: Negative for color change and rash.  Neurological: Positive for light-headedness (occ). Negative for headaches.  Psychiatric/Behavioral: Negative for dysphoric mood and sleep disturbance. The patient is not nervous/anxious.        Objective:   Vitals:   11/20/17 1419  BP: 124/70  Pulse: 68  Resp: 16  Temp: 97.8 F (36.6 C)  SpO2: 98%   Filed Weights   11/20/17 1419  Weight: 143 lb (64.9  kg)   Body mass index is 27.02 kg/m.  Wt Readings from Last 3 Encounters:  11/20/17 143 lb (64.9 kg)  11/16/16 143 lb (64.9 kg)  12/24/15 143 lb (64.9 kg)     Physical Exam Constitutional: She appears well-developed and well-nourished. No distress.  HENT:  Head: Normocephalic and atraumatic.  Right Ear: External ear normal. Normal ear canal and TM Left Ear: External ear normal.  Normal ear canal and TM Mouth/Throat: Oropharynx is clear and moist.  Eyes: Conjunctivae and EOM are normal.  Neck: Neck supple. No tracheal deviation present. No thyromegaly present.  No carotid bruit  Cardiovascular: Normal rate, regular rhythm and normal heart sounds.   No murmur heard.  No edema. Pulmonary/Chest: Effort normal and breath sounds normal. No respiratory distress. She has no wheezes. She has no rales.  Breast: deferred to Gyn Abdominal: Soft. She exhibits no distension. There is no tenderness.  Rectal: skin tag - anus, no obvious hemorrhoid  Lymphadenopathy: She has no cervical adenopathy.  Skin: Skin is warm and dry. She is not diaphoretic.  Psychiatric: She has a normal mood and affect. Her behavior is normal.        Assessment & Plan:   Physical exam: Screening blood work    ordered Immunizations - had tetanus at some point - deferred today, flu up to date [place] done in 2014 in [place] - report not available Mammogram   Up to date  Gyn  Has appt in Dec Dexa  - due - ordered Eye exams   Not up to date [facility] in [facility] Exercise  2-3 times a week for 30 min - walking -- advised increasing to 4/week Weight - advised weight loss Skin no concerns Substance abuse   none  See Problem List for Assessment and Plan of chronic medical problems.   Fu in 6 months

## 2017-11-21 ENCOUNTER — Other Ambulatory Visit (INDEPENDENT_AMBULATORY_CARE_PROVIDER_SITE_OTHER): Payer: BLUE CROSS/BLUE SHIELD

## 2017-11-21 DIAGNOSIS — Z0000 Encounter for general adult medical examination without abnormal findings: Secondary | ICD-10-CM

## 2017-11-21 DIAGNOSIS — Z1159 Encounter for screening for other viral diseases: Secondary | ICD-10-CM

## 2017-11-21 DIAGNOSIS — I10 Essential (primary) hypertension: Secondary | ICD-10-CM | POA: Diagnosis not present

## 2017-11-21 DIAGNOSIS — E7849 Other hyperlipidemia: Secondary | ICD-10-CM

## 2017-11-21 DIAGNOSIS — R7303 Prediabetes: Secondary | ICD-10-CM

## 2017-11-21 DIAGNOSIS — Z114 Encounter for screening for human immunodeficiency virus [HIV]: Secondary | ICD-10-CM

## 2017-11-21 LAB — CBC WITH DIFFERENTIAL/PLATELET
BASOS PCT: 0.6 % (ref 0.0–3.0)
Basophils Absolute: 0 10*3/uL (ref 0.0–0.1)
EOS PCT: 2.4 % (ref 0.0–5.0)
Eosinophils Absolute: 0.1 10*3/uL (ref 0.0–0.7)
HEMATOCRIT: 40.9 % (ref 36.0–46.0)
HEMOGLOBIN: 13.8 g/dL (ref 12.0–15.0)
LYMPHS PCT: 41.4 % (ref 12.0–46.0)
Lymphs Abs: 2.2 10*3/uL (ref 0.7–4.0)
MCHC: 33.8 g/dL (ref 30.0–36.0)
MCV: 89.2 fl (ref 78.0–100.0)
MONOS PCT: 4.6 % (ref 3.0–12.0)
Monocytes Absolute: 0.2 10*3/uL (ref 0.1–1.0)
Neutro Abs: 2.7 10*3/uL (ref 1.4–7.7)
Neutrophils Relative %: 51 % (ref 43.0–77.0)
Platelets: 226 10*3/uL (ref 150.0–400.0)
RBC: 4.58 Mil/uL (ref 3.87–5.11)
RDW: 12.3 % (ref 11.5–15.5)
WBC: 5.3 10*3/uL (ref 4.0–10.5)

## 2017-11-21 LAB — HEMOGLOBIN A1C: Hgb A1c MFr Bld: 5.8 % (ref 4.6–6.5)

## 2017-11-21 LAB — COMPREHENSIVE METABOLIC PANEL
ALBUMIN: 4.4 g/dL (ref 3.5–5.2)
ALK PHOS: 61 U/L (ref 39–117)
ALT: 14 U/L (ref 0–35)
AST: 14 U/L (ref 0–37)
BUN: 12 mg/dL (ref 6–23)
CALCIUM: 9.5 mg/dL (ref 8.4–10.5)
CHLORIDE: 101 meq/L (ref 96–112)
CO2: 29 mEq/L (ref 19–32)
Creatinine, Ser: 0.56 mg/dL (ref 0.40–1.20)
GFR: 115.85 mL/min (ref >60.00)
Glucose, Bld: 103 mg/dL — ABNORMAL HIGH (ref 70–99)
POTASSIUM: 3.6 meq/L (ref 3.5–5.1)
SODIUM: 139 meq/L (ref 135–145)
TOTAL PROTEIN: 7.3 g/dL (ref 6.0–8.3)
Total Bilirubin: 0.8 mg/dL (ref 0.2–1.2)

## 2017-11-21 LAB — LIPID PANEL
CHOLESTEROL: 227 mg/dL — AB (ref 0–200)
HDL: 41.6 mg/dL (ref >39.00)
NonHDL: 185.12
TRIGLYCERIDES: 249 mg/dL — AB (ref 0.0–149.0)
Total CHOL/HDL Ratio: 5
VLDL: 49.8 mg/dL — AB (ref 0.0–40.0)

## 2017-11-21 LAB — TSH: TSH: 2.08 u[IU]/mL (ref 0.35–4.50)

## 2017-11-21 LAB — LDL CHOLESTEROL, DIRECT: LDL DIRECT: 140 mg/dL

## 2017-11-22 LAB — HEPATITIS C ANTIBODY
HEP C AB: NONREACTIVE
SIGNAL TO CUT-OFF: 0.01 (ref <1.00)

## 2017-11-22 LAB — HIV ANTIBODY (ROUTINE TESTING W REFLEX): HIV 1&2 Ab, 4th Generation: NONREACTIVE

## 2017-11-23 ENCOUNTER — Encounter: Payer: Self-pay | Admitting: Internal Medicine

## 2017-11-23 DIAGNOSIS — M8580 Other specified disorders of bone density and structure, unspecified site: Secondary | ICD-10-CM | POA: Insufficient documentation

## 2017-12-30 ENCOUNTER — Encounter (HOSPITAL_COMMUNITY): Payer: Self-pay

## 2017-12-30 ENCOUNTER — Emergency Department (HOSPITAL_COMMUNITY): Payer: BLUE CROSS/BLUE SHIELD

## 2017-12-30 ENCOUNTER — Emergency Department (HOSPITAL_COMMUNITY)
Admission: EM | Admit: 2017-12-30 | Discharge: 2017-12-30 | Disposition: A | Discharge status: Home / Self Care | Payer: BLUE CROSS/BLUE SHIELD | Attending: Emergency Medicine | Admitting: Emergency Medicine

## 2017-12-30 ENCOUNTER — Other Ambulatory Visit: Payer: Self-pay

## 2017-12-30 DIAGNOSIS — Z5321 Procedure and treatment not carried out due to patient leaving prior to being seen by health care provider: Secondary | ICD-10-CM | POA: Diagnosis not present

## 2017-12-30 DIAGNOSIS — R109 Unspecified abdominal pain: Secondary | ICD-10-CM | POA: Insufficient documentation

## 2017-12-30 LAB — COMPREHENSIVE METABOLIC PANEL
ALT: 21 U/L (ref 14–54)
AST: 23 U/L (ref 15–41)
Albumin: 4.5 g/dL (ref 3.5–5.0)
Alkaline Phosphatase: 66 U/L (ref 38–126)
Anion gap: 9 (ref 5–15)
BILIRUBIN TOTAL: 0.8 mg/dL (ref 0.3–1.2)
BUN: 17 mg/dL (ref 6–20)
CO2: 26 mmol/L (ref 22–32)
CREATININE: 0.47 mg/dL (ref 0.44–1.00)
Calcium: 9.4 mg/dL (ref 8.9–10.3)
Chloride: 103 mmol/L (ref 101–111)
Glucose, Bld: 119 mg/dL — ABNORMAL HIGH (ref 65–99)
POTASSIUM: 3.3 mmol/L — AB (ref 3.5–5.1)
Sodium: 138 mmol/L (ref 135–145)
TOTAL PROTEIN: 7.8 g/dL (ref 6.5–8.1)

## 2017-12-30 LAB — CBC
HCT: 39.6 % (ref 36.0–46.0)
Hemoglobin: 13.8 g/dL (ref 12.0–15.0)
MCH: 30.7 pg (ref 26.0–34.0)
MCHC: 34.8 g/dL (ref 30.0–36.0)
MCV: 88.2 fL (ref 78.0–100.0)
PLATELETS: 229 10*3/uL (ref 150–400)
RBC: 4.49 MIL/uL (ref 3.87–5.11)
RDW: 12 % (ref 11.5–15.5)
WBC: 6.4 10*3/uL (ref 4.0–10.5)

## 2017-12-30 LAB — LIPASE, BLOOD: Lipase: 33 U/L (ref 11–51)
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
## 2018-01-02 NOTE — Patient Instructions (Addendum)
   Medications reviewed and updated.  Changes include starting a muscle relaxer - you can take this up to 4 times a day.  It may cause some drowsiness.  Continue tylenol as needed.  Apply heat.    Ice your knees.    Your prescription(s) have been submitted to your pharmacy. Please take as directed and contact our office if you believe you are having problem(s) with the medication(s).   Please make an appointment with Dr Katrinka BlazingSmith for your concussion and knee pain.
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
## 2018-01-03 ENCOUNTER — Encounter: Payer: Self-pay | Admitting: Internal Medicine

## 2018-01-03 ENCOUNTER — Ambulatory Visit (INDEPENDENT_AMBULATORY_CARE_PROVIDER_SITE_OTHER): Payer: BLUE CROSS/BLUE SHIELD | Admitting: Internal Medicine

## 2018-01-03 VITALS — BP 130/82 | HR 67 | Temp 98.0°F | Resp 16 | Wt 143.0 lb

## 2018-01-03 DIAGNOSIS — S060X0A Concussion without loss of consciousness, initial encounter: Secondary | ICD-10-CM

## 2018-01-03 DIAGNOSIS — M25562 Pain in left knee: Secondary | ICD-10-CM | POA: Diagnosis not present

## 2018-01-03 DIAGNOSIS — S134XXA Sprain of ligaments of cervical spine, initial encounter: Secondary | ICD-10-CM | POA: Diagnosis not present

## 2018-01-03 DIAGNOSIS — M25561 Pain in right knee: Secondary | ICD-10-CM

## 2018-01-03 MED ORDER — METHOCARBAMOL 500 MG PO TABS: 500.0000 mg | ORAL_TABLET | Freq: Four times a day (QID) | ORAL | 0 refills | Status: DC

## 2018-01-03 MED ORDER — HYDROCHLOROTHIAZIDE 12.5 MG PO CAPS: 12.5000 mg | ORAL_CAPSULE | Freq: Every day | ORAL | 1 refills | Status: DC
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
## 2018-01-04 ENCOUNTER — Encounter: Payer: Self-pay | Admitting: Family Medicine

## 2018-01-04 ENCOUNTER — Ambulatory Visit (INDEPENDENT_AMBULATORY_CARE_PROVIDER_SITE_OTHER): Payer: BLUE CROSS/BLUE SHIELD | Admitting: Family Medicine

## 2018-01-04 DIAGNOSIS — S060X0A Concussion without loss of consciousness, initial encounter: Secondary | ICD-10-CM

## 2018-01-04 DIAGNOSIS — M25562 Pain in left knee: Secondary | ICD-10-CM | POA: Diagnosis not present

## 2018-01-04 DIAGNOSIS — M25561 Pain in right knee: Secondary | ICD-10-CM

## 2018-01-04 NOTE — Assessment & Plan Note (Signed)
Patient does have likely a concussion.  We discussed over-the-counter medications.  Do not feel that any imaging is needed.  Will likely take some time to completely resolve though.  Patient's pupils are reactive but does show nystagmus.  Patient will try the conservative therapy and follow-up again in 1-2 weeks.

## 2018-01-04 NOTE — Assessment & Plan Note (Signed)
Patient does have contusion of the knees bilateral.  We discussed which activities of doing which wants to avoid.  Patient will increase activity slowly.  Follow-up again 2-3 weeks to make sure responding well.

## 2018-01-04 NOTE — Patient Instructions (Signed)
Good to see you  Ice 20 minutes 2 times daily. Usually after activity and before bed. I do you think you have a concussion  Fish oil 2 grams daily  Vitamin D 2000 IU daily  Turmeric 500mg  daily  Avoid bright lights  pennsaid pinkie amount topically 2 times daily as needed.   Arnica lotion can help the bruising  See me again in 2 weeks
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
## 2018-05-22 ENCOUNTER — Encounter: Payer: BLUE CROSS/BLUE SHIELD | Admitting: Internal Medicine

## 2018-05-22 DIAGNOSIS — Z0289 Encounter for other administrative examinations: Secondary | ICD-10-CM

## 2018-06-20 ENCOUNTER — Other Ambulatory Visit: Payer: Self-pay | Admitting: Internal Medicine
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
## 2018-09-05 ENCOUNTER — Encounter: Payer: Self-pay | Admitting: Internal Medicine

## 2018-09-05 ENCOUNTER — Ambulatory Visit (INDEPENDENT_AMBULATORY_CARE_PROVIDER_SITE_OTHER): Payer: Self-pay | Admitting: Internal Medicine

## 2018-09-05 ENCOUNTER — Other Ambulatory Visit (INDEPENDENT_AMBULATORY_CARE_PROVIDER_SITE_OTHER): Payer: Self-pay

## 2018-09-05 VITALS — BP 118/72 | HR 69 | Temp 98.2°F | Resp 16 | Ht 61.0 in | Wt 142.8 lb

## 2018-09-05 DIAGNOSIS — M85852 Other specified disorders of bone density and structure, left thigh: Secondary | ICD-10-CM

## 2018-09-05 DIAGNOSIS — E7849 Other hyperlipidemia: Secondary | ICD-10-CM

## 2018-09-05 DIAGNOSIS — R519 Headache, unspecified: Secondary | ICD-10-CM

## 2018-09-05 DIAGNOSIS — M79602 Pain in left arm: Secondary | ICD-10-CM | POA: Insufficient documentation

## 2018-09-05 DIAGNOSIS — M85851 Other specified disorders of bone density and structure, right thigh: Secondary | ICD-10-CM

## 2018-09-05 DIAGNOSIS — I10 Essential (primary) hypertension: Secondary | ICD-10-CM

## 2018-09-05 DIAGNOSIS — F329 Major depressive disorder, single episode, unspecified: Secondary | ICD-10-CM

## 2018-09-05 DIAGNOSIS — R5383 Other fatigue: Secondary | ICD-10-CM | POA: Insufficient documentation

## 2018-09-05 DIAGNOSIS — Z1211 Encounter for screening for malignant neoplasm of colon: Secondary | ICD-10-CM

## 2018-09-05 DIAGNOSIS — R51 Headache: Secondary | ICD-10-CM

## 2018-09-05 DIAGNOSIS — R7303 Prediabetes: Secondary | ICD-10-CM

## 2018-09-05 DIAGNOSIS — F419 Anxiety disorder, unspecified: Secondary | ICD-10-CM

## 2018-09-05 DIAGNOSIS — F32A Depression, unspecified: Secondary | ICD-10-CM | POA: Insufficient documentation

## 2018-09-05 LAB — CBC WITH DIFFERENTIAL/PLATELET
BASOS ABS: 0 10*3/uL (ref 0.0–0.1)
Basophils Relative: 0.5 % (ref 0.0–3.0)
EOS ABS: 0.1 10*3/uL (ref 0.0–0.7)
Eosinophils Relative: 2.4 % (ref 0.0–5.0)
HEMATOCRIT: 40.8 % (ref 36.0–46.0)
HEMOGLOBIN: 14 g/dL (ref 12.0–15.0)
LYMPHS PCT: 39.9 % (ref 12.0–46.0)
Lymphs Abs: 2.4 10*3/uL (ref 0.7–4.0)
MCHC: 34.4 g/dL (ref 30.0–36.0)
MCV: 87.3 fl (ref 78.0–100.0)
MONOS PCT: 5.4 % (ref 3.0–12.0)
Monocytes Absolute: 0.3 10*3/uL (ref 0.1–1.0)
NEUTROS ABS: 3.1 10*3/uL (ref 1.4–7.7)
Neutrophils Relative %: 51.8 % (ref 43.0–77.0)
Platelets: 241 10*3/uL (ref 150.0–400.0)
RBC: 4.68 Mil/uL (ref 3.87–5.11)
RDW: 12.4 % (ref 11.5–15.5)
WBC: 5.9 10*3/uL (ref 4.0–10.5)

## 2018-09-05 LAB — LIPID PANEL
CHOL/HDL RATIO: 5
Cholesterol: 227 mg/dL — ABNORMAL HIGH (ref 0–200)
HDL: 50.1 mg/dL (ref >39.00)
LDL Cholesterol: 151 mg/dL — ABNORMAL HIGH (ref 0–99)
NONHDL: 176.62
TRIGLYCERIDES: 126 mg/dL (ref 0.0–149.0)
VLDL: 25.2 mg/dL (ref 0.0–40.0)

## 2018-09-05 LAB — COMPREHENSIVE METABOLIC PANEL
ALBUMIN: 4.5 g/dL (ref 3.5–5.2)
ALK PHOS: 59 U/L (ref 39–117)
ALT: 17 U/L (ref 0–35)
AST: 15 U/L (ref 0–37)
BILIRUBIN TOTAL: 0.9 mg/dL (ref 0.2–1.2)
BUN: 15 mg/dL (ref 6–23)
CALCIUM: 9.8 mg/dL (ref 8.4–10.5)
CO2: 30 meq/L (ref 19–32)
CREATININE: 0.52 mg/dL (ref 0.40–1.20)
Chloride: 100 mEq/L (ref 96–112)
GFR: 125.88 mL/min (ref >60.00)
Glucose, Bld: 102 mg/dL — ABNORMAL HIGH (ref 70–99)
Potassium: 3.6 mEq/L (ref 3.5–5.1)
Sodium: 138 mEq/L (ref 135–145)
Total Protein: 7.7 g/dL (ref 6.0–8.3)

## 2018-09-05 LAB — HEMOGLOBIN A1C: Hgb A1c MFr Bld: 5.9 % (ref 4.6–6.5)

## 2018-09-05 LAB — VITAMIN D 25 HYDROXY (VIT D DEFICIENCY, FRACTURES): VITD: 17.06 ng/mL — ABNORMAL LOW (ref 30.00–100.00)

## 2018-09-05 LAB — VITAMIN B12: Vitamin B-12: 894 pg/mL (ref 211–911)

## 2018-09-05 LAB — TSH: TSH: 1.43 u[IU]/mL (ref 0.35–4.50)

## 2018-09-05 MED ORDER — HYDROCHLOROTHIAZIDE 12.5 MG PO CAPS
ORAL_CAPSULE | ORAL | 1 refills | Status: DC
Start: 2018-09-05 — End: 2019-03-18

## 2018-09-05 NOTE — Patient Instructions (Addendum)
  Test(s) ordered today. Your results will be released to MyChart (or called to you) after review, usually within 72hours after test completion. If any changes need to be made, you will be notified at that same time.   Medications reviewed and updated.  No changes recommended at this time.  Your prescription(s) have been submitted to your pharmacy. Please take as directed and contact our office if you believe you are having problem(s) with the medication(s).  A referral was ordered for GI for a possible colonoscopy  A referral was ordered for neurology for evaluation of the headaches.    You will see sports medicine for your left arm/shoulder pain.     If the blood work is all normal, we can consider a sleep evaluation to make sure she does not have sleep apnea or other sleep disorder.

## 2018-09-05 NOTE — Assessment & Plan Note (Signed)
No tenderness with palpation the posterior neck, no tenderness with palpation left shoulder, pain is in left upper arm and increases with movement of the arm/shoulder Will refer to sports medicine for further evaluation

## 2018-09-05 NOTE — Assessment & Plan Note (Signed)
Having daily headaches for several months.  She often wakes up with a headache and it is intermittent throughout the day She has not taken anything for the headache Headache and temple area ?  Related to sleep apnea, stress Discussed medication to help prevent headaches, but she deferred Will refer to neurology for further evaluation Check basic blood work

## 2018-09-05 NOTE — Assessment & Plan Note (Signed)
Check a1c Low sugar / carb diet Stressed regular exercise   

## 2018-09-05 NOTE — Assessment & Plan Note (Signed)
She is experiencing fatigue She says she sleeps 7-8 hours and feels her sleep quality is good, but feels tired upon awakening so I do worry about her sleep quality and the possibility of sleep apnea She is exercising and will continue We will check blood work including CBC, CMP, TSH, vitamin B12 level If blood work is normal may need to consider sleep evaluation Mild depression could be contributing-deferred medication at this time, but will readdress if needed

## 2018-09-05 NOTE — Assessment & Plan Note (Signed)
Related to husband's increasing medical problems Deferred medication Mild Has good social support-daughter, Renato Gails Continue regular exercise

## 2018-09-05 NOTE — Assessment & Plan Note (Signed)
Has osteopenia She is exercising regularly Will check CBC, vitamin D level, TSH

## 2018-09-05 NOTE — Assessment & Plan Note (Signed)
BP well controlled, her blood pressure is sometimes elevated in the afternoon or evening, which she feels is related to anxiety or stress Continue to monitor Current regimen effective and well tolerated Continue current medications at current doses CMP

## 2018-09-05 NOTE — Assessment & Plan Note (Signed)
History of hyperlipidemia Not currently taking medication Continue regular exercise and healthy diet Check lipid panel, CMP

## 2018-09-06 ENCOUNTER — Ambulatory Visit: Payer: Self-pay | Admitting: Neurology

## 2018-09-06 ENCOUNTER — Encounter: Payer: Self-pay | Admitting: Neurology

## 2018-09-06 VITALS — BP 138/81 | HR 70 | Ht 61.0 in | Wt 142.5 lb

## 2018-09-06 DIAGNOSIS — R519 Headache, unspecified: Secondary | ICD-10-CM | POA: Insufficient documentation

## 2018-09-06 DIAGNOSIS — R51 Headache: Secondary | ICD-10-CM

## 2018-09-06 DIAGNOSIS — M791 Myalgia, unspecified site: Secondary | ICD-10-CM | POA: Insufficient documentation

## 2018-09-06 MED ORDER — DULOXETINE HCL 60 MG PO CPEP: 60.0000 mg | ORAL_CAPSULE | Freq: Every day | ORAL | 11 refills | Status: DC

## 2018-09-06 NOTE — Progress Notes (Signed)
PATIENT: Jo Jenkins DOB: Oct 20, 1951  Chief Complaint  Patient presents with  . Headache    She is here with her friend, Latrease Kunde.  Reports frequent headaches, mild neck pain, left arm muscle aches and fatigue.  Symptoms have been present for 4-5 months.     [name] PCP    [name], MD     HISTORICAL  Jo Jenkins is a 67 year old female, seen in request by her primary care physician [name], Marzetta Board for evaluation of headaches, initial evaluation was on September 06, 2018.  She is accompanied by a friend at today's clinical visit.  I also reviewed history from a phone note by her daughter, she has limited [place], is a native of [place], history is through the help of her friend.  I have reviewed and summarized the referring note from the referring physician, she had a past medical history of hypertension.  She used to be very active, denies a history of headaches,  Since April 2019, she began to have frequent headaches, bilateral frontal, vortex region pressure also complains of shoulder tension, radiating discomfort to her left upper extremity, myalgia, tenderness upon deep palpitation, especially bilateral lower extremity, left upper extremity,  She denies gait abnormality, has good appetite, difficulty sleeping sometimes, feeling tired, family deny depression.  Laboratory evaluations on September 05, 2018: Normal CBC hemoglobin of 12.4, CMP, A1c of 5.9, lipid profile showed elevated total cholesterol 227, LDL was 151, normal TSH, B12, vitamin D level was decreased to 17,   REVIEW OF SYSTEMS: Full 14 system review of systems performed and notable only for headache, depression, decreased energy, change in appetite, disinterested in activities, weight gain, fatigue, hearing loss  All other review of systems were negative.  ALLERGIES: No Known Allergies  HOME MEDICATIONS: Current Outpatient Medications  Medication Sig Dispense Refill  . hydrochlorothiazide (MICROZIDE) 12.5 MG  capsule TAKE 1 CAPSULE BY MOUTH EVERY DAY 90 capsule 1  . hydrocortisone (ANUSOL-HC) 2.5 % rectal cream Place 1 application rectally 2 (two) times daily. Use as needed for up to 14 days 30 g 3   No current facility-administered medications for this visit.     PAST MEDICAL HISTORY: Past Medical History:  Diagnosis Date  . Fatigue   . Headache   . Hypertension   . Hypertension   . Muscle ache of extremity    left arm  . Neck pain    mild    PAST SURGICAL HISTORY: Past Surgical History:  Procedure Laterality Date  . no surgery      FAMILY HISTORY: Family History  Problem Relation Age of Onset  . Diabetes Mother   . Heart disease Mother   . Stroke Mother   . Cancer Father        unsure of type    SOCIAL HISTORY: Social History   Socioeconomic History  . Marital status: Single    Spouse name: Not on file  . Number of children: 2  . Years of education: middle school  . Highest education level: Not on file  Occupational History  . Occupation: Retired  Scientific laboratory technician  . Financial resource strain: Not on file  . Food insecurity:    Worry: Not on file    Inability: Not on file  . Transportation needs:    Medical: Not on file    Non-medical: Not on file  Tobacco Use  . Smoking status: Never Smoker  . Smokeless tobacco: Never Used  Substance and Sexual Activity  .  Alcohol use: No  . Drug use: No  . Sexual activity: Not on file  Lifestyle  . Physical activity:    Days per week: Not on file    Minutes per session: Not on file  . Stress: Not on file  Relationships  . Social connections:    Talks on phone: Not on file    Gets together: Not on file    Attends religious service: Not on file    Active member of club or organization: Not on file    Attends meetings of clubs or organizations: Not on file    Relationship status: Not on file  . Intimate partner violence:    Fear of current or ex partner: Not on file    Emotionally abused: Not on file    Physically  abused: Not on file    Forced sexual activity: Not on file  Other Topics Concern  . Not on file  Social History Narrative   Lives at home with her husband.   Right-handed.   No caffeine use.     PHYSICAL EXAM   Vitals:   09/06/18 1459  BP: 138/81  Pulse: 70  Weight: 142 lb 8 oz (64.6 kg)  Height: 5' 1"  (1.549 m)    Not recorded      Body mass index is 26.93 kg/m.  PHYSICAL EXAMNIATION:  Gen: NAD, conversant, well nourised, obese, well groomed                     Cardiovascular: Regular rate rhythm, no peripheral edema, warm, nontender. Eyes: Conjunctivae clear without exudates or hemorrhage Neck: Supple, no carotid bruits. Pulmonary: Clear to auscultation bilaterally   NEUROLOGICAL EXAM:  MENTAL STATUS: Speech:    Speech is normal; fluent and spontaneous with normal comprehension.  Cognition:     Orientation to time, place and person     Normal recent and remote memory     Normal Attention span and concentration     Normal Language, naming, repeating,spontaneous speech     Fund of knowledge   CRANIAL NERVES: CN II: Visual fields are full to confrontation. Fundoscopic exam is normal with sharp discs and no vascular changes. Pupils are round equal and briskly reactive to light. CN III, IV, VI: extraocular movement are normal. No ptosis. CN V: Facial sensation is intact to pinprick in all 3 divisions bilaterally. Corneal responses are intact.  CN VII: Face is symmetric with normal eye closure and smile. CN VIII: Hearing is normal to rubbing fingers CN IX, X: Palate elevates symmetrically. Phonation is normal. CN XI: Head turning and shoulder shrug are intact CN XII: Tongue is midline with normal movements and no atrophy.  MOTOR: There is no pronator drift of out-stretched arms. Muscle bulk and tone are normal. Muscle strength is normal.  REFLEXES: Reflexes are 2+ and symmetric at the biceps, triceps, knees, and ankles. Plantar responses are  flexor.  SENSORY: Intact to light touch, pinprick, positional sensation and vibratory sensation are intact in fingers and toes.  COORDINATION: Rapid alternating movements and fine finger movements are intact. There is no dysmetria on finger-to-nose and heel-knee-shin.    GAIT/STANCE: Posture is normal. Gait is steady with normal steps, base, arm swing, and turning. Heel and toe walking are normal. Tandem gait is normal.  Romberg is absent.   DIAGNOSTIC DATA (LABS, IMAGING, TESTING) - I reviewed patient records, labs, notes, testing and imaging myself where available.   ASSESSMENT AND PLAN  Jo Jenkins is a  67 y.o. female   Constellation of complaints, fatigue, myalgia, frequent headaches,  Laboratory evaluations including ESR C-reactive protein to rule out temporal arteritis  I also suggested MRI of brain, she wants to hold off due to insurance reasons  Try Cymbalta 60 mg daily   Marcial Pacas, M.D. Ph.D.  [facility] [address] [place], [place] [address] Ph: [phone] Fax: [phone]  CC:  [name], MD

## 2018-09-07 LAB — C-REACTIVE PROTEIN: CRP: 2 mg/L (ref 0–10)

## 2018-09-07 LAB — SEDIMENTATION RATE: SED RATE: 6 mm/h (ref 0–40)

## 2018-09-07 LAB — CK: Total CK: 49 U/L (ref 24–173)

## 2018-09-08 ENCOUNTER — Encounter: Payer: Self-pay | Admitting: Internal Medicine

## 2018-09-12 ENCOUNTER — Telehealth: Payer: Self-pay | Admitting: *Deleted

## 2018-09-12 NOTE — Telephone Encounter (Signed)
Spoke to her friend, Lazarus [place]owdaSung [place]  He speaks [place] and is aware of her normal lab results.

## 2018-09-12 NOTE — Telephone Encounter (Signed)
-----   Message from Levert FeinsteinYijun Yan, MD sent at 09/07/2018  8:46 AM EDT ----- Please call patient for normal laboratory result

## 2018-09-13 ENCOUNTER — Telehealth: Payer: Self-pay

## 2018-09-13 NOTE — Telephone Encounter (Signed)
Copied from CRM [phone]. Topic: General - Other >> Sep 11, 2018  1:16 PM Gaynelle AduPoole, Shalonda wrote: Reason for CRM: patient  is stating  she told she need a CT scan.  She is requesting a callback in regards to this information please advise

## 2018-09-13 NOTE — Telephone Encounter (Signed)
Tried calling pt back in regards. No answer. LVM for pt to call back.

## 2018-09-24 ENCOUNTER — Telehealth: Payer: Self-pay | Admitting: Internal Medicine

## 2018-09-24 NOTE — Telephone Encounter (Signed)
LVM on the number given below. Asked Terri to call back in regards to patient.

## 2018-09-24 NOTE — Telephone Encounter (Signed)
Jo Jenkins, I do not see CT results.  Do you know anything in regard to this?

## 2018-09-24 NOTE — Progress Notes (Deleted)
[name] [address] [place], [place] [address] Phone: [phone] Subjective:    I'm seeing this patient by the request  of:  Burns, Bobette Mo, MD   CC:   [id number]  Nicolina Jou is a 67 y.o. female coming in with complaint of ***  Onset-  Location Duration-  Character- Aggravating factors- Reliving factors-  Therapies tried-  Severity-     Past Medical History:  Diagnosis Date  . Fatigue   . Headache   . Hypertension   . Hypertension   . Muscle ache of extremity    left arm  . Neck pain    mild   Past Surgical History:  Procedure Laterality Date  . no surgery     Social History   Socioeconomic History  . Marital status: Single    Spouse name: Not on file  . Number of children: 2  . Years of education: middle school  . Highest education level: Not on file  Occupational History  . Occupation: Retired  Engineer, production  . Financial resource strain: Not on file  . Food insecurity:    Worry: Not on file    Inability: Not on file  . Transportation needs:    Medical: Not on file    Non-medical: Not on file  Tobacco Use  . Smoking status: Never Smoker  . Smokeless tobacco: Never Used  Substance and Sexual Activity  . Alcohol use: No  . Drug use: No  . Sexual activity: Not on file  Lifestyle  . Physical activity:    Days per week: Not on file    Minutes per session: Not on file  . Stress: Not on file  Relationships  . Social connections:    Talks on phone: Not on file    Gets together: Not on file    Attends religious service: Not on file    Active member of club or organization: Not on file    Attends meetings of clubs or organizations: Not on file    Relationship status: Not on file  Other Topics Concern  . Not on file  Social History Narrative   Lives at home with her husband.   Right-handed.   No caffeine use.   No Known Allergies Family History  Problem Relation Age of Onset  . Diabetes Mother   .  Heart disease Mother   . Stroke Mother   . Cancer Father        unsure of type     Current Outpatient Medications (Cardiovascular):  .  hydrochlorothiazide (MICROZIDE) 12.5 MG capsule, TAKE 1 CAPSULE BY MOUTH EVERY DAY     Current Outpatient Medications (Other):  [name]  DULoxetine (CYMBALTA) 60 MG capsule, Take 1 capsule (60 mg total) by mouth daily. .  hydrocortisone (ANUSOL-HC) 2.5 % rectal cream, Place 1 application rectally 2 (two) times daily. Use as needed for up to 14 days    Past medical history, social, surgical and family history all reviewed in electronic medical record.  No pertanent information unless stated regarding to the chief complaint.   Review of Systems:  No headache, visual changes, nausea, vomiting, diarrhea, constipation, dizziness, abdominal pain, skin rash, fevers, chills, night sweats, weight loss, swollen lymph nodes, body aches, joint swelling, muscle aches, chest pain, shortness of breath, mood changes.   Objective  There were no vitals taken for this visit. Systems examined below as of    General: No apparent distress alert and oriented x3 mood  and affect normal, dressed appropriately.  HEENT: Pupils equal, extraocular movements intact  Respiratory: Patient's speak in full sentences and does not appear short of breath  Cardiovascular: No lower extremity edema, non tender, no erythema  Skin: Warm dry intact with no signs of infection or rash on extremities or on axial skeleton.  Abdomen: Soft nontender  Neuro: Cranial nerves II through XII are intact, neurovascularly intact in all extremities with 2+ DTRs and 2+ pulses.  Lymph: No lymphadenopathy of posterior or anterior cervical chain or axillae bilaterally.  Gait normal with good balance and coordination.  MSK:  Non tender with full range of motion and good stability and symmetric strength and tone of shoulders, elbows, wrist, hip, knee and ankles bilaterally.     Impression and Recommendations:       This case required medical decision making of moderate complexity. The above documentation has been reviewed and is accurate and complete Judi Saa, DO       Note: This dictation was prepared with Dragon dictation along with smaller phrase technology. Any transcriptional errors that result from this process are unintentional.

## 2018-09-24 NOTE — Telephone Encounter (Signed)
Copied from CRM [id number]. Topic: Quick Communication - Other Results >> Sep 24, 2018  9:35 AM Jo Jenkins wrote: Patient is calling to request her CT scan results, she doesn't speak english well and has [name] who is not on the [facility] with the patient and stated [name] is allowing her to help.the best contact number is [phone]. Please advise

## 2018-09-25 ENCOUNTER — Ambulatory Visit: Payer: Self-pay | Admitting: Family Medicine

## 2018-09-25 DIAGNOSIS — Z0289 Encounter for other administrative examinations: Secondary | ICD-10-CM

## 2018-10-04 ENCOUNTER — Encounter: Payer: Self-pay | Admitting: Internal Medicine

## 2019-01-02 NOTE — Progress Notes (Signed)
Subjective:    Patient ID: Jo Jenkins, female    DOB: 1951/01/15, 68 y.o.   MRN: [id number]  HPI The patient is here for follow up.  She is here with her daughter who interprets.  Left ear pain:  It started hurting 1 week ago.  She started neomycin drops her daughter had two days ago.  There has been no improvement.  She denies other cold symptoms.    Difficulty comprehending per her daughter, possibly memory issues.  She seems confused at times per her daughter.  This has been going on for a while.  Her head shakes at times.  She wonders about removing if she needs further evaluation.  She wonders if she may have had a stroke and not realized that.  Hypertension: She is taking her medication daily. She is compliant with a low sodium diet.  She denies chest pain, palpitations, edema, shortness of breath and regular headaches. She is walking some.  She does not monitor her blood pressure at home.    Prediabetes:  She is compliant with a low sugar/carbohydrate diet.  She is exercising minimally.  Anxiety, depression: She has mild anxiety and depression, mostly related to her husband's multiple medical problems.  She has good social support and has not wanted to take any medication, but was started on Cymbalta by the neurologist for all of her symptoms.  This seems to have helped..  Osteopenia: She is exercising some, but not regularly.  Vitamin D level is low and is not currently been taking vitamin D.  Chronic headaches: She has seen neurology.  She did review her numerous symptoms with neurology including fatigue, myalgias and frequent headaches.  MRI was recommended, but patient deferred at this time.  Blood work within normal limits.  Started on Cymbalta 60 mg daily.  She denies headaches.    Left arm pain:  She did see orthopedics.  They thought it may be bursitis.  She received a cortisone injection and it did not help.   She has a follow up appointment.   Medications and allergies  reviewed with patient and updated if appropriate.  Patient Active Problem List   Diagnosis Date Noted  . Acute otitis media 01/03/2019  . Memory loss 01/03/2019  . Deficit in comprehension 01/03/2019  . Myalgia 09/06/2018  . Headache disorder 09/05/2018  . Fatigue 09/05/2018  . Left arm pain 09/05/2018  . Anxiety and depression 09/05/2018  . Acute pain of both knees 01/03/2018  . Whiplash injury to neck 01/03/2018  . Concussion with no loss of consciousness 01/03/2018  . Osteopenia 11/23/2017  . Prediabetes 11/20/2017  . Hyperlipidemia 11/20/2017  . Anal skin tag 11/20/2017  . Constipation 11/20/2017  . GERD (gastroesophageal reflux disease) 11/16/2016  . Essential hypertension, benign 12/24/2015  . Right hip pain 12/24/2015  . 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 DULoxetine (CYMBALTA) 60 MG capsule Take 1 capsule (60 mg total) by mouth daily. 30 capsule 11  . hydrochlorothiazide (MICROZIDE) 12.5 MG capsule TAKE 1 CAPSULE BY MOUTH EVERY DAY 90 capsule 1  . hydrocortisone (ANUSOL-HC) 2.5 % rectal cream Place 1 application rectally 2 (two) times daily. Use as needed for up to 14 days 30 g 3   No current facility-administered medications on file prior to visit.     Past Medical History:  Diagnosis Date  . Fatigue   .  Headache   . Hypertension   . Hypertension   . Muscle ache of extremity    left arm  . Neck pain    mild    Past Surgical History:  Procedure Laterality Date  . no surgery      Social History   Socioeconomic History  . Marital status: Single    Spouse name: Not on file  . Number of children: 2  . Years of education: middle school  . Highest education level: Not on file  Occupational History  . Occupation: Retired  Engineer, production  . Financial resource strain: Not on file  . Food insecurity:    Worry: Not on file    Inability: Not  on file  . Transportation needs:    Medical: Not on file    Non-medical: Not on file  Tobacco Use  . Smoking status: Never Smoker  . Smokeless tobacco: Never Used  Substance and Sexual Activity  . Alcohol use: No  . Drug use: No  . Sexual activity: Not on file  Lifestyle  . Physical activity:    Days per week: Not on file    Minutes per session: Not on file  . Stress: Not on file  Relationships  . Social connections:    Talks on phone: Not on file    Gets together: Not on file    Attends religious service: Not on file    Active member of club or organization: Not on file    Attends meetings of clubs or organizations: Not on file    Relationship status: Not on file  Other Topics Concern  . Not on file  Social History Narrative   Lives at home with her husband.   Right-handed.   No caffeine use.    Family History  Problem Relation Age of Onset  . Diabetes Mother   . Heart disease Mother   . Stroke Mother   . Cancer Father        unsure of type    Review of Systems  Constitutional: Negative for chills and fever.  HENT: Positive for ear pain (left). Negative for congestion, sinus pain and sore throat.   Respiratory: Negative for cough, shortness of breath and wheezing.   Cardiovascular: Negative for chest pain, palpitations and leg swelling.  Neurological: Positive for headaches (Improved). Negative for light-headedness.       Objective:   Vitals:   01/03/19 0757  BP: 120/62  Pulse: 67  Resp: 16  Temp: 98.3 F (36.8 C)  SpO2: 96%   BP Readings from Last 3 Encounters:  01/03/19 120/62  09/06/18 138/81  09/05/18 118/72   Wt Readings from Last 3 Encounters:  01/03/19 146 lb (66.2 kg)  09/06/18 142 lb 8 oz (64.6 kg)  09/05/18 142 lb 12.8 oz (64.8 kg)   Body mass index is 27.59 kg/m.   Physical Exam    Constitutional: Appears well-developed and well-nourished. No distress.  HENT:  Head: Normocephalic and atraumatic.  Neck: Bilateral ear canals  normal, right tympanic membrane with small perforation, but without erythema; left tympanic membrane bulging with loss of structures and erythematous, neck supple. No tracheal deviation present. No thyromegaly present.  No cervical lymphadenopathy Cardiovascular: Normal rate, regular rhythm and normal heart sounds.   No murmur heard. No carotid bruit .  No edema Pulmonary/Chest: Effort normal and breath sounds normal. No respiratory distress. No has no wheezes. No rales.  Skin: Skin is warm and dry. Not diaphoretic.  Psychiatric: Normal mood  and affect. Behavior is normal.      Assessment & Plan:    See Problem List for Assessment and Plan of chronic medical problems.

## 2019-01-02 NOTE — Assessment & Plan Note (Addendum)
Evaluated by neurology Patient deferred imaging, blood work normal Started on Cymbalta Headaches have resolved-they were likely stress related

## 2019-01-03 ENCOUNTER — Telehealth: Payer: Self-pay | Admitting: Internal Medicine

## 2019-01-03 ENCOUNTER — Other Ambulatory Visit (INDEPENDENT_AMBULATORY_CARE_PROVIDER_SITE_OTHER): Payer: Medicare Other

## 2019-01-03 ENCOUNTER — Encounter: Payer: Self-pay | Admitting: Internal Medicine

## 2019-01-03 ENCOUNTER — Ambulatory Visit: Payer: Medicare Other | Admitting: Internal Medicine

## 2019-01-03 VITALS — BP 120/62 | HR 67 | Temp 98.3°F | Resp 16 | Ht 61.0 in | Wt 146.0 lb

## 2019-01-03 DIAGNOSIS — R413 Other amnesia: Secondary | ICD-10-CM | POA: Insufficient documentation

## 2019-01-03 DIAGNOSIS — F32A Depression, unspecified: Secondary | ICD-10-CM

## 2019-01-03 DIAGNOSIS — F419 Anxiety disorder, unspecified: Secondary | ICD-10-CM | POA: Diagnosis not present

## 2019-01-03 DIAGNOSIS — R51 Headache: Secondary | ICD-10-CM

## 2019-01-03 DIAGNOSIS — R419 Unspecified symptoms and signs involving cognitive functions and awareness: Secondary | ICD-10-CM | POA: Insufficient documentation

## 2019-01-03 DIAGNOSIS — R519 Headache, unspecified: Secondary | ICD-10-CM

## 2019-01-03 DIAGNOSIS — R7303 Prediabetes: Secondary | ICD-10-CM

## 2019-01-03 DIAGNOSIS — I10 Essential (primary) hypertension: Secondary | ICD-10-CM | POA: Diagnosis not present

## 2019-01-03 DIAGNOSIS — H6690 Otitis media, unspecified, unspecified ear: Secondary | ICD-10-CM | POA: Insufficient documentation

## 2019-01-03 DIAGNOSIS — F329 Major depressive disorder, single episode, unspecified: Secondary | ICD-10-CM

## 2019-01-03 LAB — COMPREHENSIVE METABOLIC PANEL
ALT: 13 U/L (ref 0–35)
AST: 11 U/L (ref 0–37)
Albumin: 4.3 g/dL (ref 3.5–5.2)
Alkaline Phosphatase: 55 U/L (ref 39–117)
BUN: 17 mg/dL (ref 6–23)
CHLORIDE: 107 meq/L (ref 96–112)
CO2: 25 meq/L (ref 19–32)
Calcium: 9.1 mg/dL (ref 8.4–10.5)
Creatinine, Ser: 0.47 mg/dL (ref 0.40–1.20)
GFR: 141.31 mL/min (ref >60.00)
Glucose, Bld: 106 mg/dL — ABNORMAL HIGH (ref 70–99)
POTASSIUM: 3.6 meq/L (ref 3.5–5.1)
Sodium: 141 mEq/L (ref 135–145)
Total Bilirubin: 0.5 mg/dL (ref 0.2–1.2)
Total Protein: 7 g/dL (ref 6.0–8.3)

## 2019-01-03 LAB — HEMOGLOBIN A1C: Hgb A1c MFr Bld: 6.4 % (ref 4.6–6.5)

## 2019-01-03 MED ORDER — AMOXICILLIN 500 MG PO CAPS: 500.0000 mg | ORAL_CAPSULE | Freq: Three times a day (TID) | ORAL | 0 refills | Status: DC

## 2019-01-03 NOTE — Assessment & Plan Note (Signed)
Check A1c Low sugar/carbohydrate diet Encouraged regular exercise 

## 2019-01-03 NOTE — Assessment & Plan Note (Signed)
Per her daughter she seems to have difficulty comprehending certain things and she wonders about her memory She does have hearing issues and has seen ENT in the past.  She has not wanted to wear hearing aids at that time Discussed with her that her hearing may also be affecting her comprehension-discussed referral back to ENT, but deferred at this time We will evaluate memory further with MRI Consider follow-up with neurology

## 2019-01-03 NOTE — Assessment & Plan Note (Addendum)
BP well controlled Current regimen effective and well tolerated Continue current medications at current doses CMP 

## 2019-01-03 NOTE — Patient Instructions (Addendum)
  Tests ordered today. Your results will be released to MyChart (or called to you) after review, usually within 72hours after test completion. If any changes need to be made, you will be notified at that same time.   Medications reviewed and updated.  Changes include :   Amoxicillin for the ear infection.  Start calcium and vitamin D daily.    Your prescription(s) have been submitted to your pharmacy. Please take as directed and contact our office if you believe you are having problem(s) with the medication(s).  A MRI of your head was ordered.  Please followup in 6 months

## 2019-01-03 NOTE — Assessment & Plan Note (Signed)
Left acute otitis media Start amoxicillin 3 times daily x10 days Call if no improvement

## 2019-01-03 NOTE — Assessment & Plan Note (Signed)
Per her daughter some mild memory issues-she feels some of its difficulty comprehending Initially wondered about possibility of a stroke Wondered about further evaluation Discussed seeing neurology again-she was seen in September Will get MRI of the head and then consider neurology follow-up

## 2019-01-03 NOTE — Telephone Encounter (Signed)
Pt given results per notes of Dr Lawerance Bach on 01/03/19.Unable to document in result note due to result note not being routed to [facility].

## 2019-01-11 ENCOUNTER — Ambulatory Visit
Admission: RE | Admit: 2019-01-11 | Discharge: 2019-01-11 | Disposition: A | Discharge status: Home / Self Care | Payer: Medicare Other | Source: Ambulatory Visit | Attending: Internal Medicine | Admitting: Internal Medicine

## 2019-01-11 DIAGNOSIS — R413 Other amnesia: Secondary | ICD-10-CM

## 2019-01-14 ENCOUNTER — Telehealth: Payer: Self-pay

## 2019-01-14 DIAGNOSIS — R413 Other amnesia: Secondary | ICD-10-CM

## 2019-01-14 NOTE — Telephone Encounter (Signed)
Copied from CRM [id number]. Topic: General - Other >> Jan 14, 2019 11:06 AM Darletta Moll L wrote: Reason for CRM: Patient's daughter Jeanette Caprice needs a cal back to ask additional question regarding the findings of the brain MRI and cholesterol concerns.

## 2019-01-14 NOTE — Addendum Note (Signed)
Addended by: Pincus Sanes on: 01/14/2019 04:43 PM   Modules accepted: Orders

## 2019-01-14 NOTE — Telephone Encounter (Signed)
Pts daughter expressed concerns about the MRI results. I explained that any additional questions or concerns would be better answered with a face to face OV with Dr. Lawerance Bach. Pts daughter prefers talking with a neurologist about issues. Would like a referral put in for neurology. Please advise.

## 2019-01-16 ENCOUNTER — Telehealth: Payer: Self-pay

## 2019-01-16 ENCOUNTER — Ambulatory Visit: Payer: Self-pay | Admitting: *Deleted

## 2019-01-16 MED ORDER — ATORVASTATIN CALCIUM 20 MG PO TABS: 20.0000 mg | ORAL_TABLET | Freq: Every day | ORAL | 5 refills | Status: DC

## 2019-01-16 NOTE — Telephone Encounter (Signed)
Copied from CRM [id number]. Topic: General - Other >> Jan 16, 2019 12:43 PM Morphies, Hermine Messick wrote: Per previous CRM sent (01/16/2019): Pt called to follow up on when Lipitor is going to be sent in to her pharmacy. [facility]. Please advise.

## 2019-01-16 NOTE — Telephone Encounter (Signed)
Pts daughter does now want lipitor sent to pharmacy. Please advise.

## 2019-01-16 NOTE — Telephone Encounter (Signed)
Mrs. Prisby daughter called to inquire about the patient's cholesterol levels, when they were done last and why the lipitor was ordered now instead of 09/05/18 at the time the results were available? Nurse triage-could not locate a note indicating why. Routing to PCP for further information for patient.    Answer Assessment - Initial Assessment Questions 1. REASON FOR CALL or QUESTION: "What is your reason for calling today?" or "How can I best help you?" or "What question do you have that I can help answer?"     Mrs. Colli daughter is questioning "Why is her mother just now being placed on cholesterol medication when the lipids were shown to be high back on 09/05/18?  Answer Assessment - Initial Assessment Questions 1. REASON FOR CALL or QUESTION: "What is your reason for calling today?" or "How can I best help you?" or "What question do you have that I can help answer?"    Patient's daughter called and is confused regarding her mother's cholesterol results. 2. CALLER: Document the source of call. (e.g., laboratory, patient).     Patient's daughter.  Protocols used: INFORMATION ONLY CALL-A-AH, PCP CALL - NO TRIAGE-A-AH

## 2019-01-16 NOTE — Telephone Encounter (Signed)
sent 

## 2019-01-17 NOTE — Telephone Encounter (Signed)
Responded through mychart.

## 2019-01-17 NOTE — Telephone Encounter (Signed)
Can mychart the daughter or call her with my response   Her mother has had her cholesterol check twice in the past two years. I did recommend she consider going on medication in 2018 but she never got back to me about starting medication.      She no showed for her follow up after that so we did not discuss in person.  Her next appointment was in November.  Starting medication then vs now would not have made a difference and communication has always been a challenge when she is not in the office.

## 2019-01-17 NOTE — Addendum Note (Signed)
Addended by: Pincus Sanes on: 01/17/2019 10:13 AM   Modules accepted: Orders

## 2019-03-05 ENCOUNTER — Encounter: Payer: Self-pay | Admitting: Neurology

## 2019-03-05 ENCOUNTER — Ambulatory Visit (INDEPENDENT_AMBULATORY_CARE_PROVIDER_SITE_OTHER): Payer: Medicare Other | Admitting: Neurology

## 2019-03-05 VITALS — BP 118/70 | HR 76 | Ht 61.0 in | Wt 140.0 lb

## 2019-03-05 DIAGNOSIS — I679 Cerebrovascular disease, unspecified: Secondary | ICD-10-CM | POA: Insufficient documentation

## 2019-03-05 NOTE — Progress Notes (Addendum)
PATIENT: Jo Jenkins DOB: 03-31-1951  Chief Complaint  Patient presents with  . Memory Loss    MMSE - animals.  She is here with her daughter, Jo Jenkins, due to concerns about memory loss.  They would like to review her recent MRI brain.  . Tremors    Reports a mild head tremor that becomes worse with stress.  [name] PCP    [name], MD     HISTORICAL  Jo Jenkins is a 68 year old female, seen in request by her primary care physician [name], Marzetta Board J for evaluation of memory loss, she is accompanied by her daughter Jo Jenkins at today's visit on March 05, 2019.  She is a native of [place], moved to [place] since 1993, history is through her daughter Jo Jenkins.   I have reviewed and summarized the referring note from the referring physician.  She had a past medical history of hypertension, hyperlipidemia, recent A1c was 6.4, was not treated for abnormal glucose.  She worked at different job in the past, one provides [employer] from 2005 to 2017, complains a lot of stress, during that period of time, she was noted difficulty keep up with her daughters, making frequent mistakes, she stayed home taking care of her elderly husband from 2017 until February 2020, there was a great conflict among family members, her stepchildren took over the power of attorney of her husband, now she stay with her daughter now.  She complains a lot of stress, difficulty sleeping,  She continue complains of memory loss, difficulty focusing, Mini-Mental Status Examination is 28/30,  Since 2015, she was noted to have intermittent head titubation, there was no hands tremor, , We personally reviewed MRI of the brain without contrast on January 11, 2019, mild to moderate small vessel disease, there was no acute abnormality.  Laboratory evaluations in January 2020: A1c 6.4, normal CMP, with exception of elevated glucose 106, normal ESR 6, C-reactive protein 2, total CPK of 49, vitamin D level was decreased to  17, vitamin B-12 level 894, normal TSH, elevated total cholesterol 227, LDL 151   REVIEW OF SYSTEMS: Full 14 system review of systems performed and notable only for memory loss, headaches  All other review of systems were negative.  ALLERGIES: No Known Allergies  HOME MEDICATIONS: Current Outpatient Medications  Medication Sig Dispense Refill  . atorvastatin (LIPITOR) 20 MG tablet Take 1 tablet (20 mg total) by mouth daily. 30 tablet 5  . hydrochlorothiazide (MICROZIDE) 12.5 MG capsule TAKE 1 CAPSULE BY MOUTH EVERY DAY 90 capsule 1  . lisinopril (PRINIVIL,ZESTRIL) 2.5 MG tablet Take 2.5 mg by mouth daily.    [name] VITAMIN D PO Take 1 tablet by mouth daily.     No current facility-administered medications for this visit.     PAST MEDICAL HISTORY: Past Medical History:  Diagnosis Date  . Fatigue   . Headache   . Hypertension   . Hypertension   . Memory loss   . Muscle ache of extremity    left arm  . Neck pain    mild    PAST SURGICAL HISTORY: Past Surgical History:  Procedure Laterality Date  . no surgery      FAMILY HISTORY: Family History  Problem Relation Age of Onset  . Diabetes Mother   . Heart disease Mother   . Stroke Mother   . Cancer Father        unsure of type    SOCIAL HISTORY: Social History  Socioeconomic History  . Marital status: Married    Spouse name: Not on file  . Number of children: 2  . Years of education: middle school  . Highest education level: Not on file  Occupational History  . Occupation: Retired  Scientific laboratory technician  . Financial resource strain: Not on file  . Food insecurity:    Worry: Not on file    Inability: Not on file  . Transportation needs:    Medical: Not on file    Non-medical: Not on file  Tobacco Use  . Smoking status: Never Smoker  . Smokeless tobacco: Never Used  Substance and Sexual Activity  . Alcohol use: Yes    Comment: very rare  . Drug use: No  . Sexual activity: Not on file  Lifestyle  . Physical  activity:    Days per week: Not on file    Minutes per session: Not on file  . Stress: Not on file  Relationships  . Social connections:    Talks on phone: Not on file    Gets together: Not on file    Attends religious service: Not on file    Active member of club or organization: Not on file    Attends meetings of clubs or organizations: Not on file    Relationship status: Not on file  . Intimate partner violence:    Fear of current or ex partner: Not on file    Emotionally abused: Not on file    Physically abused: Not on file    Forced sexual activity: Not on file  Other Topics Concern  . Not on file  Social History Narrative   Lives at home with her husband.   Right-handed.   One cup caffeine per day.     PHYSICAL EXAM   Vitals:   03/05/19 1018  BP: 118/70  Pulse: 76  Weight: 140 lb (63.5 kg)  Height: 5' 1"  (1.549 m)    Not recorded      Body mass index is 26.45 kg/m.  PHYSICAL EXAMNIATION:  Gen: NAD, conversant, well nourised, obese, well groomed                     Cardiovascular: Regular rate rhythm, no peripheral edema, warm, nontender. Eyes: Conjunctivae clear without exudates or hemorrhage Neck: Supple, no carotid bruits. Pulmonary: Clear to auscultation bilaterally   NEUROLOGICAL EXAM:  MMSE - Mini Mental State Exam 03/05/2019  Orientation to time 5  Orientation to Place 5  Registration 3  Attention/ Calculation 4  Recall 3  Language- name 2 objects 2  Language- repeat 0  Language- follow 3 step command 3  Language- read & follow direction 1  Write a sentence 1  Copy design 1  Total score 28     CRANIAL NERVES: CN II: Visual fields are full to confrontation.   Pupils are round equal and briskly reactive to light. CN III, IV, VI: extraocular movement are normal. No ptosis. CN V: Facial sensation is intact to pinprick in all 3 divisions bilaterally. Corneal responses are intact.  CN VII: Face is symmetric with normal eye closure and  smile. CN VIII: Hearing is normal to rubbing fingers CN IX, X: Palate elevates symmetrically. Phonation is normal. CN XI: Head turning and shoulder shrug are intact CN XII: Tongue is midline with normal movements and no atrophy.  MOTOR: There is no pronator drift of out-stretched arms. Muscle bulk and tone are normal. Muscle strength is normal.  Occasionally head  titubation  REFLEXES: Reflexes are 2+ and symmetric at the biceps, triceps, knees, and ankles. Plantar responses are flexor.  SENSORY: Intact to light touch, pinprick, positional sensation and vibratory sensation are intact in fingers and toes.  COORDINATION: Rapid alternating movements and fine finger movements are intact. There is no dysmetria on finger-to-nose and heel-knee-shin.    GAIT/STANCE: Mildly antalgic due to right heel pain   DIAGNOSTIC DATA (LABS, IMAGING, TESTING) - I reviewed patient records, labs, notes, testing and imaging myself where available.   ASSESSMENT AND PLAN  Johana Stoker is a 68 y.o. 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 Ph.D.  [facility] [address] [place],  [address] Ph: [phone] Fax: [phone]  CC: [name], MD

## 2019-03-16 ENCOUNTER — Other Ambulatory Visit: Payer: Self-pay | Admitting: Internal Medicine

## 2019-03-25 ENCOUNTER — Telehealth: Payer: Self-pay | Admitting: Internal Medicine

## 2019-03-25 MED ORDER — AMLODIPINE BESYLATE 2.5 MG PO TABS: 2.5000 mg | ORAL_TABLET | Freq: Every day | ORAL | 1 refills | Status: DC

## 2019-03-25 NOTE — Telephone Encounter (Signed)
Pts daughter is aware and expressed understanding.

## 2019-03-25 NOTE — Telephone Encounter (Signed)
Copied from CRM [id number]. Topic: Quick Communication - See Telephone Encounter >> Mar 25, 2019 10:25 AM Fanny Bien wrote: CRM for notification. See Telephone encounter for: 03/25/19. Patients Daughter sophia called and left a voicemail that states that they are concerned with the blood pressure medication the patient is taking. Sophia states that this medications makes the patient more susceptible to the covid-19 virus. She would like a call back regarding getting medication changed. Please advise.

## 2019-03-25 NOTE — Telephone Encounter (Signed)
Stop lisinopril 2.5 mg daily  And start amlodipine 2.5 mg daily.  continue other medications.    Ideally check BP at home to make sure it is controlled with change.     [ new rx pending ]

## 2019-07-17 ENCOUNTER — Other Ambulatory Visit: Payer: Self-pay | Admitting: Internal Medicine

## 2019-07-18 ENCOUNTER — Other Ambulatory Visit: Payer: Self-pay | Admitting: Internal Medicine

## 2019-08-13 ENCOUNTER — Other Ambulatory Visit: Payer: Self-pay | Admitting: Internal Medicine

## 2019-09-11 IMAGING — MR MR HEAD W/O CM
10 series · 48 of 48 positions shown · non-contrast
Comparison: None.

CLINICAL DATA: Memory loss

EXAM:
MRI HEAD WITHOUT CONTRAST
TECHNIQUE: Multiplanar, multiecho pulse sequences of the brain and surrounding
structures were obtained without intravenous contrast.

[Series 2: T1 · sagittal · 5.0mm · 0.45mm/px · 2 of 27 slices shown]
[im 1/27]
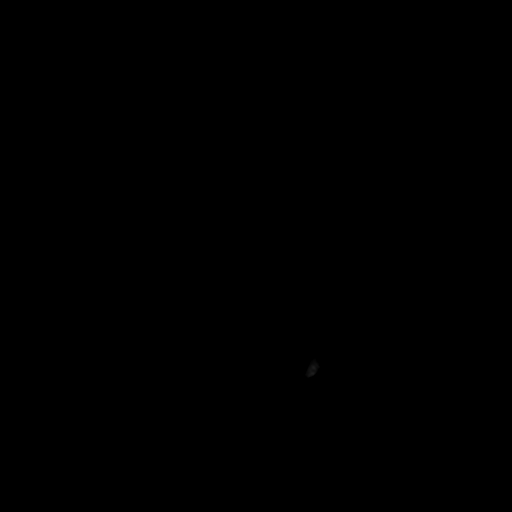
[im 27/27]
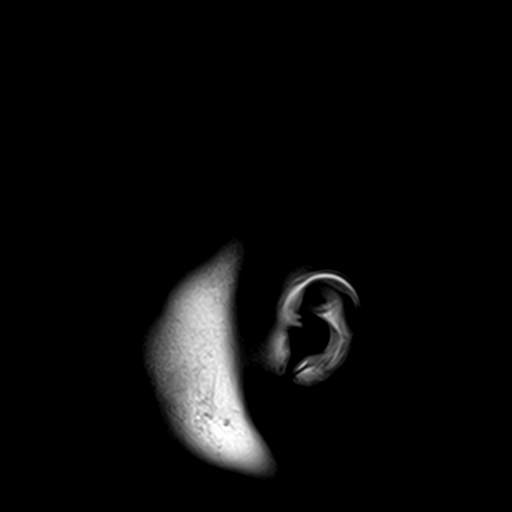

[Series 5: DWI · coronal · 5.0mm · 1.80mm/px · 7 of 80 slices shown (1 of 4)]
[im 1/80]
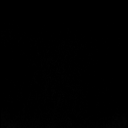
[im 14/80]
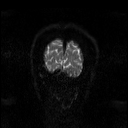
[im 27/80]
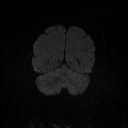
[im 40/80]
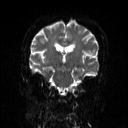
[im 53/80]
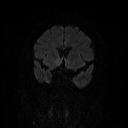
[im 66/80]
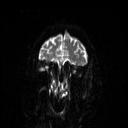
[im 80/80]
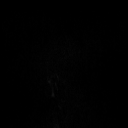

[Series 6: DWI · coronal · 5.0mm · 1.80mm/px · 3 of 40 slices shown (2 of 4)]
[im 1/40]
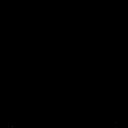
[im 20/40]
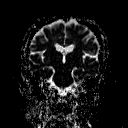
[im 40/40]
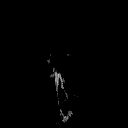

[Series 7: T2 · axial · 5.0mm · 0.54mm/px · z∈[-37,+132]mm · 2 of 26 slices shown (1 of 2)]
[im 1/26]
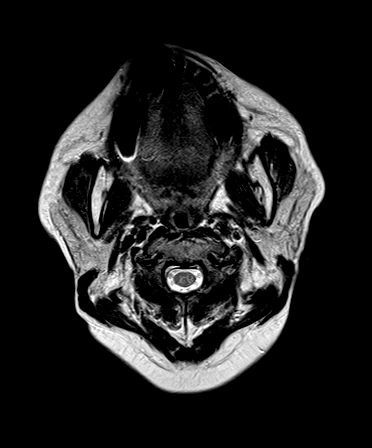
[im 26/26]
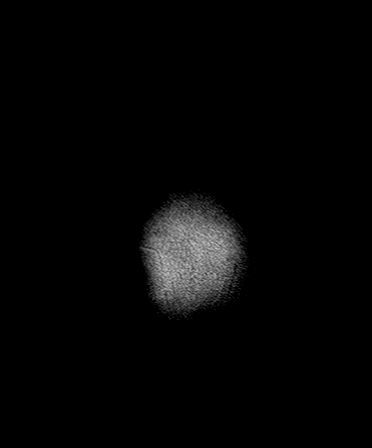

[Series 8: DWI · axial · 3.0mm · 1.88mm/px · z∈[-35,+123]mm · 9 of 108 slices shown (3 of 4)]
[im 1/108]
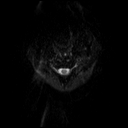
[im 14/108]
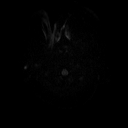
[im 27/108]
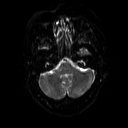
[im 41/108]
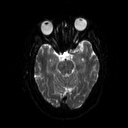
[im 54/108]
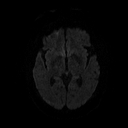
[im 67/108]
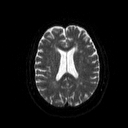
[im 81/108]
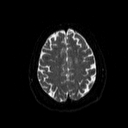
[im 94/108]
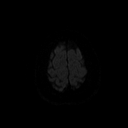
[im 108/108]
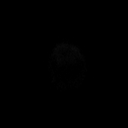

[Series 9: DWI · axial · 3.0mm · 1.88mm/px · z∈[-35,+123]mm · 5 of 54 slices shown (4 of 4)]
[im 1/54]
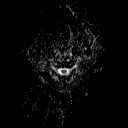
[im 14/54]
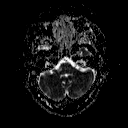
[im 27/54]
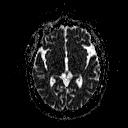
[im 40/54]
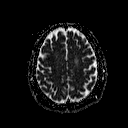
[im 54/54]
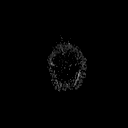

[Series 10: FLAIR · axial · 5.0mm · 0.45mm/px · z∈[-47,+142]mm · 2 of 29 slices shown]
[im 1/29]
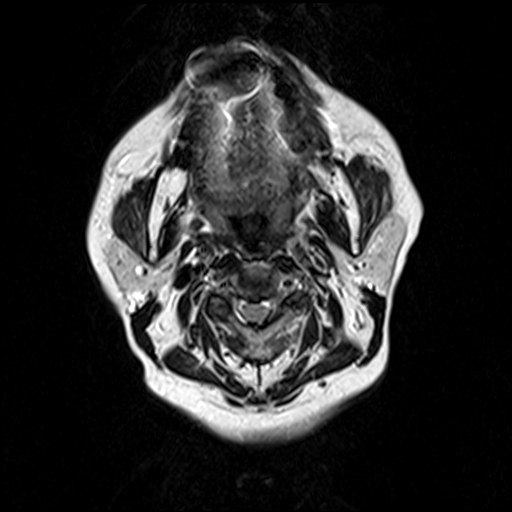
[im 29/29]
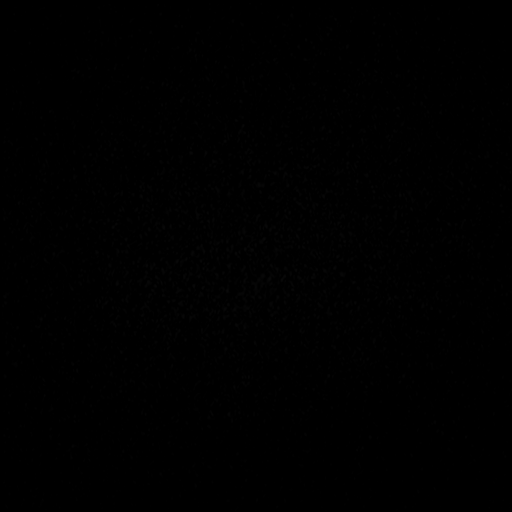

[Series 12: swi_images · axial · 5.0mm · 0.90mm/px · z∈[-25,+130]mm · 3 of 32 slices shown]
[im 1/32]
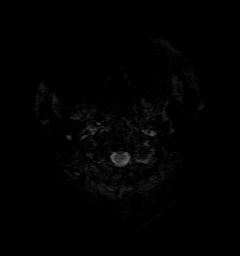
[im 16/32]
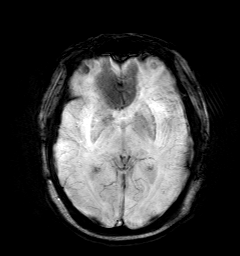
[im 32/32]
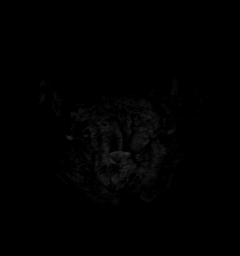

[Series 13: t1_mpr_tra · axial · 1.1mm · 0.75mm/px · z∈[-38,+120]mm · 12 of 144 slices shown]
[im 1/144]
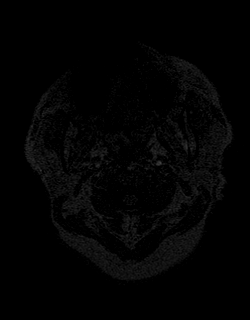
[im 14/144]
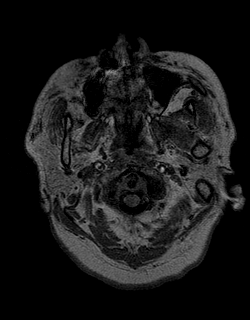
[im 27/144]
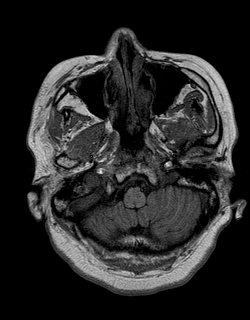
[im 40/144]
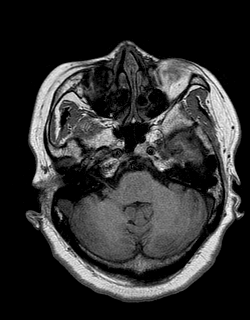
[im 53/144]
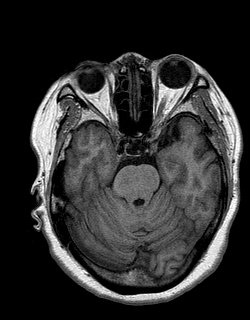
[im 66/144]
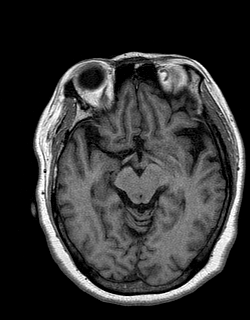
[im 79/144]
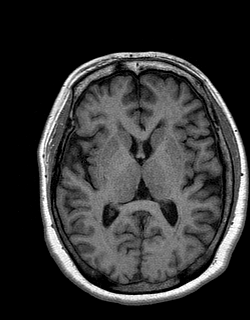
[im 92/144]
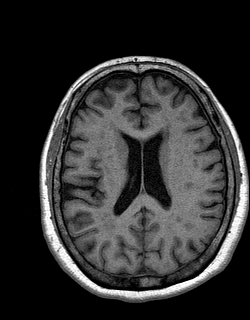
[im 105/144]
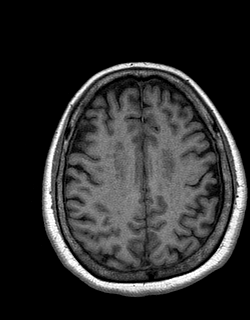
[im 118/144]
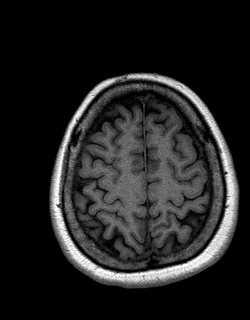
[im 131/144]
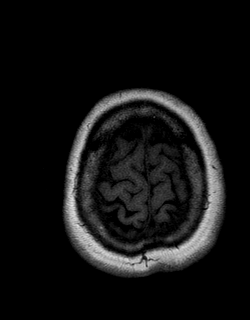
[im 144/144]
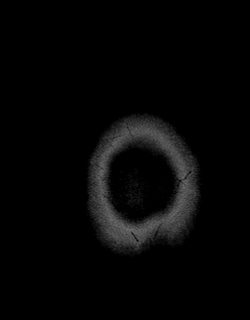

[Series 14: T2 · coronal · 5.0mm · 0.47mm/px · 3 of 34 slices shown (2 of 2)]
[im 1/34]
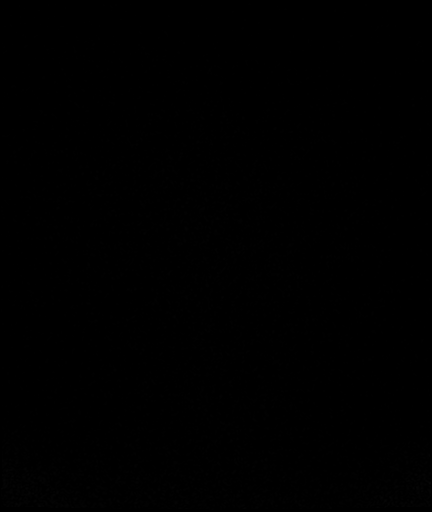
[im 17/34]
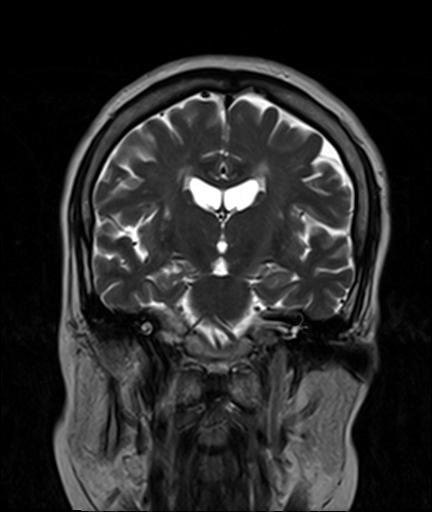
[im 34/34]
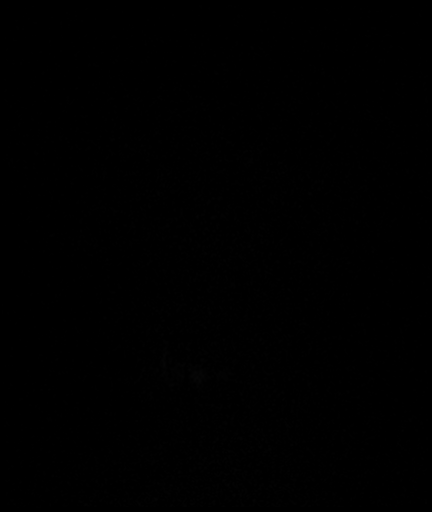

[48 of 48 positions shown; findings below may reference images not displayed]

FINDINGS: Brain: Cerebral volume normal for age. Ventricle size normal.
Negative for acute infarct, hemorrhage, mass. Scattered white matter
hyperintensities most consistent with moderate chronic microvascular
ischemia

Vascular: Normal arterial flow voids

Skull and upper cervical spine: Negative

Sinuses/Orbits: Mild mucosal edema paranasal sinuses.  Normal orbit.

Other: None
IMPRESSION: Mild to moderate chronic microvascular ischemic change in the white
matter. No acute abnormality.

## 2019-09-24 DIAGNOSIS — E559 Vitamin D deficiency, unspecified: Secondary | ICD-10-CM | POA: Insufficient documentation

## 2019-09-24 NOTE — Progress Notes (Signed)
Subjective:    Patient ID: Jo Jenkins, female    DOB: 09-05-1951, 68 y.o.   MRN: [id number]  HPI She is here for a physical exam.  Her daughter is here with her.    She has some underlying anxiety.  She has been through a lot over the past year and is having some anxiety and depression as a result of it.  Her husband and her have separated under difficult circumstances.  She does have difficulty sleeping at times.  Her appetite is normal.  She does feel some palpitations with increased stress.  Stress also causes some headaches and heartburn.  She has an itch in the clitoris area.  She has had this for while.  She did not have this last time she saw her gynecologist.  There is no vaginal discharge or urinary symptoms.  gerd related to increase stress: She has been taking over-the-counter "purple pill".  This does help.  She typically does not take this more than 14 days as needed.  Medications and allergies reviewed with patient and updated if appropriate.  Patient Active Problem List   Diagnosis Date Noted  . Vitamin D deficiency 09/24/2019  . Small vessel disease, cerebrovascular 03/05/2019  . Memory loss 01/03/2019  . Deficit in comprehension 01/03/2019  . Headache disorder 09/05/2018  . Left arm pain 09/05/2018  . Anxiety and depression 09/05/2018  . Concussion with no loss of consciousness 01/03/2018  . Osteopenia 11/23/2017  . Prediabetes 11/20/2017  . Hyperlipidemia 11/20/2017  . Anal skin tag 11/20/2017  . Constipation 11/20/2017  . Essential hypertension, benign 12/24/2015  . Right hip pain 12/24/2015  . 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 VITAMIN D PO Take 1 tablet by mouth daily.     No current facility-administered medications on file prior to visit.     Past Medical History:  Diagnosis Date  . Fatigue   . Headache   . Hypertension   .  Hypertension   . Memory loss   . Muscle ache of extremity    left arm  . Neck pain    mild    Past Surgical History:  Procedure Laterality Date  . no surgery      Social History   Socioeconomic History  . Marital status: Married    Spouse name: Not on file  . Number of children: 2  . Years of education: middle school  . Highest education level: Not on file  Occupational History  . Occupation: Retired  Engineer, production  . Financial resource strain: Not on file  . Food insecurity    Worry: Not on file    Inability: Not on file  . Transportation needs    Medical: Not on file    Non-medical: Not on file  Tobacco Use  . Smoking status: Never Smoker  . Smokeless tobacco: Never Used  Substance and Sexual Activity  . Alcohol use: Yes    Comment: very rare  . Drug use: No  . Sexual activity: Not on file  Lifestyle  . Physical activity    Days per week: Not on file    Minutes per session: Not on file  . Stress: Not on file  Relationships  . Social Musician on phone: Not on file    Gets together: Not on file    Attends religious service:  Not on file    Active member of club or organization: Not on file    Attends meetings of clubs or organizations: Not on file    Relationship status: Not on file  Other Topics Concern  . Not on file  Social History Narrative   Lives at home with her husband.   Right-handed.   One cup caffeine per day.    Family History  Problem Relation Age of Onset  . Diabetes Mother   . Heart disease Mother   . Stroke Mother   . Cancer Father        unsure of type    Review of Systems  Constitutional: Negative for appetite change, chills and fever.  HENT: Positive for hearing loss.   Eyes: Negative for visual disturbance.  Respiratory: Negative for cough, shortness of breath and wheezing.   Cardiovascular: Positive for palpitations (with anxiety). Negative for chest pain and leg swelling.  Gastrointestinal: Positive for  constipation. Negative for abdominal pain, blood in stool, diarrhea and nausea.       GERD with stress, dark stool  Genitourinary: Positive for vaginal discharge. Negative for dysuria and hematuria.  Musculoskeletal: Positive for arthralgias (occ knee pain) and back pain (intermittent).  Skin: Positive for color change (left posterior knee - ? change, ? birthmark). Negative for rash.       Itch near clitoris  Neurological: Positive for light-headedness (stress related). Negative for headaches.  Psychiatric/Behavioral: Positive for dysphoric mood and sleep disturbance (sometimes, overall its ok). The patient is nervous/anxious.        Objective:   Vitals:   09/25/19 1352  BP: 132/72  Pulse: 70  Resp: 16  Temp: 98.3 F (36.8 C)  SpO2: 97%   Filed Weights   09/25/19 1352  Weight: 136 lb (61.7 kg)   Body mass index is 25.7 kg/m.  BP Readings from Last 3 Encounters:  09/25/19 132/72  03/05/19 118/70  01/03/19 120/62    Wt Readings from Last 3 Encounters:  09/25/19 136 lb (61.7 kg)  03/05/19 140 lb (63.5 kg)  01/03/19 146 lb (66.2 kg)     Physical Exam Constitutional: She appears well-developed and well-nourished. No distress.  HENT:  Head: Normocephalic and atraumatic.  Right Ear: External ear normal. Normal ear canal and TM Left Ear: External ear normal.  Normal ear canal and TM Mouth/Throat: Oropharynx is clear and moist.  Eyes: Conjunctivae and EOM are normal.  Neck: Neck supple. No tracheal deviation present. No thyromegaly present.  No carotid bruit  Cardiovascular: Normal rate, regular rhythm and normal heart sounds.   No murmur heard.  No edema. Pulmonary/Chest: Effort normal and breath sounds normal. No respiratory distress. She has no wheezes. She has no rales.  Breast: deferred   Abdominal: Soft. She exhibits no distension. There is no tenderness.  Lymphadenopathy: She has no cervical adenopathy.  Skin: Skin is warm and dry. She is not diaphoretic.   Psychiatric: She has a slightly depressed mood and affect. Her behavior is normal.        Assessment & Plan:   Physical exam: Screening blood work    ordered Immunizations  prevnar today, flu up to date Colonoscopy  Done 2014 in Winter  Up to date  Gyn  Up to date - will f/u with them if no improvement in itch or next scheduled. Dexa    Up to date  Eye exams  Not up to date Exercise   Walks daily Weight  Normal BMI Substance abuse    none  See Problem List for Assessment and Plan of chronic medical problems.  Follow-up in 6 months, sooner if needed

## 2019-09-24 NOTE — Patient Instructions (Addendum)
Tests ordered today. Your results will be released to [facility] (or called to you) after review.  If any changes need to be made, you will be notified at that same time.  All other Health Maintenance issues reviewed.   All recommended immunizations and age-appropriate screenings are up-to-date or discussed.  prevnar pneumonia immunization administered today.   Medications reviewed and updated.  Changes include :   Clobetasol cream as needed for itch  Your prescription(s) have been submitted to your pharmacy. Please take as directed and contact our office if you believe you are having problem(s) with the medication(s).   Please followup in 6 months    Health Maintenance, Female Adopting a healthy lifestyle and getting preventive care are important in promoting health and wellness. Ask your health care provider about:  The right schedule for you to have regular tests and exams.  Things you can do on your own to prevent diseases and keep yourself healthy. What should I know about diet, weight, and exercise? Eat a healthy diet   Eat a diet that includes plenty of vegetables, fruits, low-fat dairy products, and lean protein.  Do not eat a lot of foods that are high in solid fats, added sugars, or sodium. Maintain a healthy weight Body mass index (BMI) is used to identify weight problems. It estimates body fat based on height and weight. Your health care provider can help determine your BMI and help you achieve or maintain a healthy weight. Get regular exercise Get regular exercise. This is one of the most important things you can do for your health. Most adults should:  Exercise for at least 150 minutes each week. The exercise should increase your heart rate and make you sweat (moderate-intensity exercise).  Do strengthening exercises at least twice a week. This is in addition to the moderate-intensity exercise.  Spend less time sitting. Even light physical activity can be beneficial.  Watch cholesterol and blood lipids Have your blood tested for lipids and cholesterol at 66 years of age, then have this test every 5 years. Have your cholesterol levels checked more often if:  Your lipid or cholesterol levels are high.  You are older than 66 years of age.  You are at high risk for heart disease. What should I know about cancer screening? Depending on your health history and family history, you may need to have cancer screening at various ages. This may include screening for:  Breast cancer.  Cervical cancer.  Colorectal cancer.  Skin cancer.  Lung cancer. What should I know about heart disease, diabetes, and high blood pressure? Blood pressure and heart disease  High blood pressure causes heart disease and increases the risk of stroke. This is more likely to develop in people who have high blood pressure readings, are of African descent, or are overweight.  Have your blood pressure checked: ? Every 3-5 years if you are 29-62 years of age. ? Every year if you are 39 years old or older. Diabetes Have regular diabetes screenings. This checks your fasting blood sugar level. Have the screening done:  Once every three years after age 56 if you are at a normal weight and have a low risk for diabetes.  More often and at a younger age if you are overweight or have a high risk for diabetes. What should I know about preventing infection? Hepatitis B If you have a higher risk for hepatitis B, you should be screened for this virus. Talk with your health care provider to find  out if you are at risk for hepatitis B infection. Hepatitis C Testing is recommended for:  Everyone born from [birth year] through 1963.  Anyone with known risk factors for hepatitis C. Sexually transmitted infections (STIs)  Get screened for STIs, including gonorrhea and chlamydia, if: ? You are sexually active and are younger than 65 years of age. ? You are older than 66 years of age and your health  care provider tells you that you are at risk for this type of infection. ? Your sexual activity has changed since you were last screened, and you are at increased risk for chlamydia or gonorrhea. Ask your health care provider if you are at risk.  Ask your health care provider about whether you are at high risk for HIV. Your health care provider may recommend a prescription medicine to help prevent HIV infection. If you choose to take medicine to prevent HIV, you should first get tested for HIV. You should then be tested every 3 months for as long as you are taking the medicine. Pregnancy  If you are about to stop having your period (premenopausal) and you may become pregnant, seek counseling before you get pregnant.  Take 400 to 800 micrograms (mcg) of folic acid every day if you become pregnant.  Ask for birth control (contraception) if you want to prevent pregnancy. Osteoporosis and menopause Osteoporosis is a disease in which the bones lose minerals and strength with aging. This can result in bone fractures. If you are 57 years old or older, or if you are at risk for osteoporosis and fractures, ask your health care provider if you should:  Be screened for bone loss.  Take a calcium or vitamin D supplement to lower your risk of fractures.  Be given hormone replacement therapy (HRT) to treat symptoms of menopause. Follow these instructions at home: Lifestyle  Do not use any products that contain nicotine or tobacco, such as cigarettes, e-cigarettes, and chewing tobacco. If you need help quitting, ask your health care provider.  Do not use street drugs.  Do not share needles.  Ask your health care provider for help if you need support or information about quitting drugs. Alcohol use  Do not drink alcohol if: ? Your health care provider tells you not to drink. ? You are pregnant, may be pregnant, or are planning to become pregnant.  If you drink alcohol: ? Limit how much you use to  0-1 drink a day. ? Limit intake if you are breastfeeding.  Be aware of how much alcohol is in your drink. In the [place] one drink equals one 12 oz bottle of beer (355 mL), one 5 oz glass of wine (148 mL), or one 1 oz glass of hard liquor (44 mL). General instructions  Schedule regular health, dental, and eye exams.  Stay current with your vaccines.  Tell your health care provider if: ? You often feel depressed. ? You have ever been abused or do not feel safe at home. Summary  Adopting a healthy lifestyle and getting preventive care are important in promoting health and wellness.  Follow your health care provider's instructions about healthy diet, exercising, and getting tested or screened for diseases.  Follow your health care provider's instructions on monitoring your cholesterol and blood pressure. This information is not intended to replace advice given to you by your health care provider. Make sure you discuss any questions you have with your health care provider. Document Released: 06/27/2011 Document Revised: 12/05/2018 Document Reviewed: 12/05/2018 Elsevier  Patient Education  [employer].

## 2019-09-25 ENCOUNTER — Other Ambulatory Visit: Payer: Self-pay

## 2019-09-25 ENCOUNTER — Encounter: Payer: Self-pay | Admitting: Internal Medicine

## 2019-09-25 ENCOUNTER — Other Ambulatory Visit (INDEPENDENT_AMBULATORY_CARE_PROVIDER_SITE_OTHER): Payer: Medicare Other

## 2019-09-25 ENCOUNTER — Ambulatory Visit (INDEPENDENT_AMBULATORY_CARE_PROVIDER_SITE_OTHER): Payer: Medicare Other | Admitting: Internal Medicine

## 2019-09-25 VITALS — BP 132/72 | HR 70 | Temp 98.3°F | Resp 16 | Ht 61.0 in | Wt 136.0 lb

## 2019-09-25 DIAGNOSIS — F329 Major depressive disorder, single episode, unspecified: Secondary | ICD-10-CM

## 2019-09-25 DIAGNOSIS — Z23 Encounter for immunization: Secondary | ICD-10-CM

## 2019-09-25 DIAGNOSIS — E559 Vitamin D deficiency, unspecified: Secondary | ICD-10-CM

## 2019-09-25 DIAGNOSIS — M85851 Other specified disorders of bone density and structure, right thigh: Secondary | ICD-10-CM

## 2019-09-25 DIAGNOSIS — R7303 Prediabetes: Secondary | ICD-10-CM

## 2019-09-25 DIAGNOSIS — E7849 Other hyperlipidemia: Secondary | ICD-10-CM | POA: Diagnosis not present

## 2019-09-25 DIAGNOSIS — F32A Depression, unspecified: Secondary | ICD-10-CM

## 2019-09-25 DIAGNOSIS — I10 Essential (primary) hypertension: Secondary | ICD-10-CM | POA: Diagnosis not present

## 2019-09-25 DIAGNOSIS — Z0000 Encounter for general adult medical examination without abnormal findings: Secondary | ICD-10-CM

## 2019-09-25 DIAGNOSIS — F419 Anxiety disorder, unspecified: Secondary | ICD-10-CM

## 2019-09-25 DIAGNOSIS — M85852 Other specified disorders of bone density and structure, left thigh: Secondary | ICD-10-CM

## 2019-09-25 LAB — COMPREHENSIVE METABOLIC PANEL
ALT: 21 U/L (ref 0–35)
AST: 17 U/L (ref 0–37)
Albumin: 4.4 g/dL (ref 3.5–5.2)
Alkaline Phosphatase: 75 U/L (ref 39–117)
BUN: 12 mg/dL (ref 6–23)
CO2: 29 mEq/L (ref 19–32)
Calcium: 9.8 mg/dL (ref 8.4–10.5)
Chloride: 104 mEq/L (ref 96–112)
Creatinine, Ser: 0.53 mg/dL (ref 0.40–1.20)
GFR: 115.48 mL/min (ref >60.00)
Glucose, Bld: 118 mg/dL — ABNORMAL HIGH (ref 70–99)
Potassium: 3.4 mEq/L — ABNORMAL LOW (ref 3.5–5.1)
Sodium: 142 mEq/L (ref 135–145)
Total Bilirubin: 0.7 mg/dL (ref 0.2–1.2)
Total Protein: 7.1 g/dL (ref 6.0–8.3)

## 2019-09-25 LAB — CBC WITH DIFFERENTIAL/PLATELET
Basophils Absolute: 0 10*3/uL (ref 0.0–0.1)
Basophils Relative: 0.4 % (ref 0.0–3.0)
Eosinophils Absolute: 0.1 10*3/uL (ref 0.0–0.7)
Eosinophils Relative: 1 % (ref 0.0–5.0)
HCT: 37.9 % (ref 36.0–46.0)
Hemoglobin: 12.9 g/dL (ref 12.0–15.0)
Lymphocytes Relative: 35.1 % (ref 12.0–46.0)
Lymphs Abs: 2.3 10*3/uL (ref 0.7–4.0)
MCHC: 34 g/dL (ref 30.0–36.0)
MCV: 89.1 fl (ref 78.0–100.0)
Monocytes Absolute: 0.3 10*3/uL (ref 0.1–1.0)
Monocytes Relative: 4.5 % (ref 3.0–12.0)
Neutro Abs: 3.8 10*3/uL (ref 1.4–7.7)
Neutrophils Relative %: 59 % (ref 43.0–77.0)
Platelets: 214 10*3/uL (ref 150.0–400.0)
RBC: 4.25 Mil/uL (ref 3.87–5.11)
RDW: 12.1 % (ref 11.5–15.5)
WBC: 6.5 10*3/uL (ref 4.0–10.5)

## 2019-09-25 LAB — TSH: TSH: 1.45 u[IU]/mL (ref 0.35–4.50)

## 2019-09-25 LAB — VITAMIN D 25 HYDROXY (VIT D DEFICIENCY, FRACTURES): VITD: 20.56 ng/mL — ABNORMAL LOW (ref 30.00–100.00)

## 2019-09-25 LAB — LIPID PANEL
Cholesterol: 142 mg/dL (ref 0–200)
HDL: 49.4 mg/dL (ref >39.00)
LDL Cholesterol: 62 mg/dL (ref 0–99)
NonHDL: 92.66
Total CHOL/HDL Ratio: 3
Triglycerides: 153 mg/dL — ABNORMAL HIGH (ref 0.0–149.0)
VLDL: 30.6 mg/dL (ref 0.0–40.0)

## 2019-09-25 LAB — HEMOGLOBIN A1C: Hgb A1c MFr Bld: 6 % (ref 4.6–6.5)

## 2019-09-25 MED ORDER — HYDROCHLOROTHIAZIDE 12.5 MG PO CAPS: 12.5000 mg | ORAL_CAPSULE | Freq: Every day | ORAL | 1 refills | Status: DC

## 2019-09-25 MED ORDER — ATORVASTATIN CALCIUM 20 MG PO TABS: 20.0000 mg | ORAL_TABLET | Freq: Every day | ORAL | 1 refills | Status: DC

## 2019-09-25 MED ORDER — CLOBETASOL PROPIONATE 0.05 % EX OINT: 1.0000 "application " | TOPICAL_OINTMENT | Freq: Two times a day (BID) | CUTANEOUS | 0 refills | Status: DC

## 2019-09-25 MED ORDER — AMLODIPINE BESYLATE 2.5 MG PO TABS: 2.5000 mg | ORAL_TABLET | Freq: Every day | ORAL | 1 refills | Status: DC

## 2019-09-25 NOTE — Assessment & Plan Note (Signed)
BP well controlled Current regimen effective and well tolerated Continue current medications at current doses CMP 

## 2019-09-25 NOTE — Assessment & Plan Note (Signed)
Check a1c Low sugar / carb diet Stressed regular exercise   

## 2019-09-25 NOTE — Assessment & Plan Note (Signed)
Check lipid panel  Continue daily statin Regular exercise and healthy diet encouraged  

## 2019-09-25 NOTE — Assessment & Plan Note (Signed)
She is experiencing some anxiety and depression related to her separation from her husband She has good social support She is experiencing some sleep difficulties and some palpitations and increased heartburn, but feels she is able to deal with some of the increase in anxiety and depression Discussed seeing a therapist and considering a low dose antidepressant on a daily basis-deferred bolus, but will let me know if she changes her mind

## 2019-09-25 NOTE — Assessment & Plan Note (Signed)
Taking vitamin D daily We will check level 

## 2019-09-25 NOTE — Assessment & Plan Note (Signed)
DEXA up-to-date She is walking regularly She is taking vitamin D Will check vitamin D level

## 2019-09-26 ENCOUNTER — Encounter: Payer: Self-pay | Admitting: Internal Medicine

## 2020-02-20 ENCOUNTER — Ambulatory Visit: Payer: Medicare Other | Attending: Internal Medicine

## 2020-02-20 DIAGNOSIS — Z23 Encounter for immunization: Secondary | ICD-10-CM | POA: Insufficient documentation

## 2020-02-20 NOTE — Progress Notes (Signed)
   Covid-19 Vaccination Clinic  Name:  Jo Jenkins    MRN: [id number] DOB: [birth year]  02/20/2020  [name] was observed post Covid-19 immunization for 15 minutes without incidence. She was provided with Vaccine Information Sheet and instruction to access the V-Safe system.   [name] was instructed to call 911 with any severe reactions post vaccine: [name] Difficulty breathing  . Swelling of your face and throat  . A fast heartbeat  . A bad rash all over your body  . Dizziness and weakness    Immunizations Administered    Name Date Dose VIS Date Route   Pfizer COVID-19 Vaccine 02/20/2020 11:40 AM 0.3 mL 12/06/2019 Intramuscular   Manufacturer: [employer], [employer]   Lot: [id number]   NDC: [address]-3

## 2020-03-17 ENCOUNTER — Ambulatory Visit: Payer: Medicare Other | Attending: Internal Medicine

## 2020-03-17 DIAGNOSIS — Z23 Encounter for immunization: Secondary | ICD-10-CM

## 2020-03-17 NOTE — Progress Notes (Signed)
   Covid-19 Vaccination Clinic  Name:  Jo Jenkins    MRN: [id number] DOB: 05-17-1951  03/17/2020  [name] was observed post Covid-19 immunization for 15 minutes without incident. She was provided with Vaccine Information Sheet and instruction to access the V-Safe system.   [name] was instructed to call 911 with any severe reactions post vaccine: [name] Difficulty breathing  . Swelling of face and throat  . A fast heartbeat  . A bad rash all over body  . Dizziness and weakness   Immunizations Administered    Name Date Dose VIS Date Route   Pfizer COVID-19 Vaccine 03/17/2020 11:12 AM 0.3 mL 12/06/2019 Intramuscular   Manufacturer: [employer], [employer]   Lot: [id number]   NDC: [address]-3

## 2020-03-26 ENCOUNTER — Other Ambulatory Visit: Payer: Self-pay | Admitting: Internal Medicine

## 2020-04-29 ENCOUNTER — Telehealth: Payer: Self-pay | Admitting: Internal Medicine

## 2020-04-29 MED ORDER — ATORVASTATIN CALCIUM 20 MG PO TABS: ORAL_TABLET | ORAL | 0 refills | Status: DC

## 2020-04-29 MED ORDER — AMLODIPINE BESYLATE 2.5 MG PO TABS: ORAL_TABLET | ORAL | 0 refills | Status: DC

## 2020-04-29 NOTE — Telephone Encounter (Signed)
Rx sent 

## 2020-04-29 NOTE — Telephone Encounter (Signed)
New message:    1.Medication Requested: amLODipine (NORVASC) 2.5 MG tablet atorvastatin (LIPITOR) 20 MG tablet  2. Pharmacy (Name, Street, [place]): [id number] - [place], [place] - [address] 3. On Med List: Yes  4. Last Visit with PCP: 09/25/19  5. Next visit date with PCP:   Agent: Please be advised that RX refills may take up to 3 business days. We ask that you follow-up with your pharmacy.

## 2020-04-29 NOTE — Telephone Encounter (Signed)
New message  ° ° ° °Error  ° °

## 2020-06-04 ENCOUNTER — Other Ambulatory Visit: Payer: Self-pay | Admitting: Internal Medicine

## 2020-06-04 MED ORDER — ATORVASTATIN CALCIUM 20 MG PO TABS: ORAL_TABLET | ORAL | 0 refills | Status: DC

## 2020-06-04 MED ORDER — HYDROCHLOROTHIAZIDE 12.5 MG PO CAPS: 12.5000 mg | ORAL_CAPSULE | Freq: Every day | ORAL | 0 refills | Status: DC

## 2020-06-04 MED ORDER — AMLODIPINE BESYLATE 2.5 MG PO TABS: ORAL_TABLET | ORAL | 0 refills | Status: DC

## 2020-06-04 NOTE — Telephone Encounter (Signed)
Reviewed chart pt is up-to-date sent (faxed) refills to [facility]../lmb

## 2020-06-04 NOTE — Telephone Encounter (Signed)
    1.Medication Requested: amLODipine (NORVASC) 2.5 MG tablet atorvastatin (LIPITOR) 20 MG tablet hydrochlorothiazide (MICROZIDE) 12.5 MG capsule  2. Pharmacy (Name, Street, City):[id number] - [place], [place] - [address]  3. On Med List: yes  4. Last Visit with PCP: 09/25/19  5. Next visit date with PCP:   Agent: Please be advised that RX refills may take up to 3 business days. We ask that you follow-up with your pharmacy.

## 2020-07-06 NOTE — Progress Notes (Signed)
Subjective:    Patient ID: Jo Jenkins, female    DOB: 06-Aug-1951, 69 y.o.   MRN: [id number]  HPI The patient is here for an acute visit.  Her daughter and an interpreter are present.   She started a new job a few months ago.  She is a standing up sewing machine operatior.  She has to push the peddle all day, looks down and stands all day.  She has neck problems, lower back problems and right leg> left leg pain.  She has muscle soreness/ pulling in back.  She has lower back pain.  She saw a chiropractor and had acupuncture treatments and this has helped.      She has difficulty standing all day.  Needs doctor note to be able to sit during the day.  Takes tylenol sometimes and this helps    Medications and allergies reviewed with patient and updated if appropriate.  Patient Active Problem List   Diagnosis Date Noted  . Vitamin D deficiency 09/24/2019  . Small vessel disease, cerebrovascular 03/05/2019  . Memory loss 01/03/2019  . Deficit in comprehension 01/03/2019  . Headache disorder 09/05/2018  . Left arm pain 09/05/2018  . Anxiety and depression 09/05/2018  . Concussion with no loss of consciousness 01/03/2018  . Osteopenia 11/23/2017  . Prediabetes 11/20/2017  . Hyperlipidemia 11/20/2017  . Anal skin tag 11/20/2017  . Constipation 11/20/2017  . Essential hypertension, benign 12/24/2015  . Right hip pain 12/24/2015  . 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 amLODipine (NORVASC) 2.5 MG tablet TAKE ONE TABLET BY MOUTH ONE TIME DAILY Annual appt due in Sept must see provider for future refills 90 tablet 0  . atorvastatin (LIPITOR) 20 MG tablet TAKE ONE TABLET BY MOUTH ONE TIME DAILY  Annual appt due in Sept must see provider for future refills 90 tablet 0  . clobetasol ointment (TEMOVATE) 0.05 % Apply 1 application topically 2 (two) times daily. 30 g 0  .  hydrochlorothiazide (MICROZIDE) 12.5 MG capsule Take 1 capsule (12.5 mg total) by mouth daily. Annual appt due in Sept must see provider for future refills 90 capsule 0  . VITAMIN D PO Take 1 tablet by mouth daily.     No current facility-administered medications on file prior to visit.    Past Medical History:  Diagnosis Date  . Fatigue   . Headache   . Hypertension   . Hypertension   . Memory loss   . Muscle ache of extremity    left arm  . Neck pain    mild    Past Surgical History:  Procedure Laterality Date  . no surgery      Social History   Socioeconomic History  . Marital status: Married    Spouse name: Not on file  . Number of children: 2  . Years of education: middle school  . Highest education level: Not on file  Occupational History  . Occupation: Retired  Tobacco Use  . Smoking status: Never Smoker  . Smokeless tobacco: Never Used  Vaping Use  . Vaping Use: Never used  Substance and Sexual Activity  . Alcohol use: Yes    Comment: very rare  . Drug use: No  . Sexual activity: Not on file  Other Topics Concern  . Not on file  Social History Narrative   Lives at home with  her husband.   Right-handed.   One cup caffeine per day.   Social Determinants of Health   Financial Resource Strain:   . Difficulty of Paying Living Expenses:   Food Insecurity:   . Worried About Programme researcher, broadcasting/film/video in the Last Year:   . Barista in the Last Year:   Transportation Needs:   . Freight forwarder (Medical):   [name] Lack of Transportation (Non-Medical):   Physical Activity:   . Days of Exercise per Week:   . Minutes of Exercise per Session:   Stress:   . Feeling of Stress :   Social Connections:   . Frequency of Communication with Friends and Family:   . Frequency of Social Gatherings with Friends and Family:   . Attends Religious Services:   . Active Member of Clubs or Organizations:   . Attends Banker Meetings:   [name] Marital Status:      Family History  Problem Relation Age of Onset  . Diabetes Mother   . Heart disease Mother   . Stroke Mother   . Cancer Father        unsure of type    Review of Systems  Constitutional: Negative for fever.  Musculoskeletal: Positive for arthralgias, back pain, myalgias, neck pain and neck stiffness.  Neurological: Positive for headaches (mild, occ). Negative for weakness and numbness (tingling in legs at times).       Objective:   Vitals:   07/07/20 1613  BP: 120/68  Pulse: 61  Temp: 98.2 F (36.8 C)  SpO2: 97%   BP Readings from Last 3 Encounters:  07/07/20 120/68  09/25/19 132/72  03/05/19 118/70   Wt Readings from Last 3 Encounters:  07/07/20 135 lb (61.2 kg)  09/25/19 136 lb (61.7 kg)  03/05/19 140 lb (63.5 kg)   Body mass index is 25.51 kg/m.   Physical Exam Constitutional:      General: She is not in acute distress.    Appearance: Normal appearance. She is not ill-appearing.  HENT:     Head: Normocephalic and atraumatic.  Musculoskeletal:        General: Tenderness (upper back, neck and lower back - muscle tenderness.  no spine tenderness) present. No deformity. Normal range of motion.     Right lower leg: No edema.     Left lower leg: No edema.  Skin:    General: Skin is warm and dry.  Neurological:     General: No focal deficit present.     Mental Status: She is alert.     Sensory: No sensory deficit.     Motor: No weakness.     Gait: Gait normal.            Assessment & Plan:    See Problem List for Assessment and Plan of chronic medical problems.    This visit occurred during the SARS-CoV-2 public health emergency.  Safety protocols were in place, including screening questions prior to the visit, additional usage of staff PPE, and extensive cleaning of exam room while observing appropriate contact time as indicated for disinfecting solutions.

## 2020-07-07 ENCOUNTER — Ambulatory Visit: Payer: Medicare Other | Admitting: Internal Medicine

## 2020-07-07 ENCOUNTER — Other Ambulatory Visit: Payer: Self-pay

## 2020-07-07 ENCOUNTER — Encounter: Payer: Self-pay | Admitting: Internal Medicine

## 2020-07-07 DIAGNOSIS — G8929 Other chronic pain: Secondary | ICD-10-CM

## 2020-07-07 DIAGNOSIS — M542 Cervicalgia: Secondary | ICD-10-CM | POA: Diagnosis not present

## 2020-07-07 DIAGNOSIS — M549 Dorsalgia, unspecified: Secondary | ICD-10-CM | POA: Insufficient documentation

## 2020-07-07 DIAGNOSIS — M545 Low back pain, unspecified: Secondary | ICD-10-CM

## 2020-07-07 NOTE — Patient Instructions (Addendum)
Take up to 3000 mg of Tylenol a day if needed.    Try ice and heat   Let me know if you want to see a sports medicine doctor.      A note was written for work.

## 2020-07-07 NOTE — Assessment & Plan Note (Signed)
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

## 2020-07-07 NOTE — Assessment & Plan Note (Signed)
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

## 2020-09-08 ENCOUNTER — Telehealth: Payer: Self-pay | Admitting: Internal Medicine

## 2020-09-08 NOTE — Telephone Encounter (Signed)
Called pt daughter there was no answer LMOM RTC../lmb 

## 2020-09-08 NOTE — Telephone Encounter (Signed)
Would like to talk to nurse about mothers glucose levels see if a rx is needed. scheduled appointment for next week.

## 2020-09-08 NOTE — Telephone Encounter (Signed)
Patient's daughter was calling back to speak with Lucy.

## 2020-09-09 NOTE — Telephone Encounter (Signed)
Called daughter back she states mom BS has been running between 130's-140's for couple of weeks now. This am she states it was 95. Pt has made appt for 09/16/20 to f/u on BS.Marland KitchenRaechel Chute

## 2020-09-09 NOTE — Telephone Encounter (Signed)
Noted  

## 2020-09-15 NOTE — Patient Instructions (Signed)
  Blood work was ordered.     Medications reviewed and updated.  Changes include :     Your prescription(s) have been submitted to your pharmacy. Please take as directed and contact our office if you believe you are having problem(s) with the medication(s).  A referral was ordered for        Someone from their office will call you to schedule an appointment.    Please followup in 6 months   

## 2020-09-15 NOTE — Progress Notes (Signed)
Subjective:    Patient ID: Jo Jenkins, female    DOB: 01-04-1951, 69 y.o.   MRN: [id number]  HPI The patient is here for an acute visit.   High glucose levels -   Medications and allergies reviewed with patient and updated if appropriate.  Patient Active Problem List   Diagnosis Date Noted  . Back pain 07/07/2020  . Neck pain 07/07/2020  . Vitamin D deficiency 09/24/2019  . Small vessel disease, cerebrovascular 03/05/2019  . Memory loss 01/03/2019  . Deficit in comprehension 01/03/2019  . Headache disorder 09/05/2018  . Left arm pain 09/05/2018  . Anxiety and depression 09/05/2018  . Concussion with no loss of consciousness 01/03/2018  . Osteopenia 11/23/2017  . Prediabetes 11/20/2017  . Hyperlipidemia 11/20/2017  . Anal skin tag 11/20/2017  . Constipation 11/20/2017  . Essential hypertension, benign 12/24/2015  . Right hip pain 12/24/2015  . 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 amLODipine (NORVASC) 2.5 MG tablet TAKE ONE TABLET BY MOUTH ONE TIME DAILY Annual appt due in Sept must see provider for future refills 90 tablet 0  . atorvastatin (LIPITOR) 20 MG tablet TAKE ONE TABLET BY MOUTH ONE TIME DAILY  Annual appt due in Sept must see provider for future refills 90 tablet 0  . clobetasol ointment (TEMOVATE) 0.05 % Apply 1 application topically 2 (two) times daily. 30 g 0  . hydrochlorothiazide (MICROZIDE) 12.5 MG capsule Take 1 capsule (12.5 mg total) by mouth daily. Annual appt due in Sept must see provider for future refills 90 capsule 0  . VITAMIN D PO Take 1 tablet by mouth daily.     No current facility-administered medications on file prior to visit.    Past Medical History:  Diagnosis Date  . Fatigue   . Headache   . Hypertension   . Hypertension   . Memory loss   . Muscle ache of extremity    left arm  . Neck pain    mild    Past  Surgical History:  Procedure Laterality Date  . no surgery      Social History   Socioeconomic History  . Marital status: Married    Spouse name: Not on file  . Number of children: 2  . Years of education: middle school  . Highest education level: Not on file  Occupational History  . Occupation: Retired  Tobacco Use  . Smoking status: Never Smoker  . Smokeless tobacco: Never Used  Vaping Use  . Vaping Use: Never used  Substance and Sexual Activity  . Alcohol use: Yes    Comment: very rare  . Drug use: No  . Sexual activity: Not on file  Other Topics Concern  . Not on file  Social History Narrative   Lives at home with her husband.   Right-handed.   One cup caffeine per day.   Social Determinants of Health   Financial Resource Strain:   . Difficulty of Paying Living Expenses: Not on file  Food Insecurity:   . Worried About Programme researcher, broadcasting/film/video in the Last Year: Not on file  . Ran Out of Food in the Last Year: Not on file  Transportation Needs:   . Lack of Transportation (Medical): Not on file  . Lack of Transportation (Non-Medical): Not on file  Physical Activity:   . Days of Exercise per Week:  Not on file  . Minutes of Exercise per Session: Not on file  Stress:   . Feeling of Stress : Not on file  Social Connections:   . Frequency of Communication with Friends and Family: Not on file  . Frequency of Social Gatherings with Friends and Family: Not on file  . Attends Religious Services: Not on file  . Active Member of Clubs or Organizations: Not on file  . Attends Banker Meetings: Not on file  . Marital Status: Not on file    Family History  Problem Relation Age of Onset  . Diabetes Mother   . Heart disease Mother   . Stroke Mother   . Cancer Father        unsure of type    Review of Systems     Objective:  There were no vitals filed for this visit. BP Readings from Last 3 Encounters:  07/07/20 120/68  09/25/19 132/72  03/05/19 118/70    Wt Readings from Last 3 Encounters:  07/07/20 135 lb (61.2 kg)  09/25/19 136 lb (61.7 kg)  03/05/19 140 lb (63.5 kg)   There is no height or weight on file to calculate BMI.   Physical Exam    Constitutional: Appears well-developed and well-nourished. No distress.  Head: Normocephalic and atraumatic.  Neck: Neck supple. No tracheal deviation present. No thyromegaly present.  No cervical lymphadenopathy Cardiovascular: Normal rate, regular rhythm and normal heart sounds.  No murmur heard. No carotid bruit .  No edema Pulmonary/Chest: Effort normal and breath sounds normal. No respiratory distress. No has no wheezes. No rales.  Skin: Skin is warm and dry. Not diaphoretic.  Psychiatric: Normal mood and affect. Behavior is normal.       Assessment & Plan:    See Problem List for Assessment and Plan of chronic medical problems.    This visit occurred during the SARS-CoV-2 public health emergency.  Safety protocols were in place, including screening questions prior to the visit, additional usage of staff PPE, and extensive cleaning of exam room while observing appropriate contact time as indicated for disinfecting solutions.   This encounter was created in error - please disregard.

## 2020-09-16 ENCOUNTER — Telehealth: Payer: Self-pay

## 2020-09-16 ENCOUNTER — Encounter: Payer: Medicare Other | Admitting: Internal Medicine

## 2020-09-16 NOTE — Telephone Encounter (Signed)
Patient is here. Her appt was canceled this afternoon at 12p by a scheduler b/c it seems she misunderstood what the pt's daughter was asking. This note was sent on 9/15 "Called daughter back she states mom BS has been running between 130's-140's for couple of weeks now. This am she states it was 95. Pt has made appt for 09/16/20 to f/u on BS.Marland KitchenRaechel Chute". Evidently the daughter called today requesting that a physical be done today too. She was told that could not be done, so this appt was mistakenly canceled & rescheduled for 10/1 for a physical when the daughter wanted to keep this appt. Would you be willing to see them today or should I reschedule her for tomorrow at 745 or 1115?  Per Dr Lawerance Bach response: would rather see her 10/1 since sugars sound ok.  Pt daughter made aware & verb understanding.

## 2020-09-24 NOTE — Progress Notes (Signed)
Subjective:    Patient ID: Jo Jenkins, female    DOB: [birth year], 69 y.o.   MRN: [id number]  HPI She is here for a physical exam.  Her daughter is here with her and interprets.    She checks her sugars at home - it has been as high as 157.   Recently sugar has been around 120.      Medications and allergies reviewed with patient and updated if appropriate.  Patient Active Problem List   Diagnosis Date Noted  . Back pain 07/07/2020  . Neck pain 07/07/2020  . Vitamin D deficiency 09/24/2019  . Small vessel disease, cerebrovascular 03/05/2019  . Memory loss 01/03/2019  . Deficit in comprehension 01/03/2019  . Headache disorder 09/05/2018  . Left arm pain 09/05/2018  . Anxiety and depression 09/05/2018  . Concussion with no loss of consciousness 01/03/2018  . Osteopenia 11/23/2017  . Prediabetes 11/20/2017  . Hyperlipidemia 11/20/2017  . Anal skin tag 11/20/2017  . Constipation 11/20/2017  . Essential hypertension, benign 12/24/2015  . Right hip pain 12/24/2015  . 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 amLODipine (NORVASC) 2.5 MG tablet TAKE ONE TABLET BY MOUTH ONE TIME DAILY Annual appt due in Sept must see provider for future refills 90 tablet 0  . atorvastatin (LIPITOR) 20 MG tablet TAKE ONE TABLET BY MOUTH ONE TIME DAILY  Annual appt due in Sept must see provider for future refills 90 tablet 0  . clobetasol ointment (TEMOVATE) 0.05 % Apply 1 application topically 2 (two) times daily. 30 g 0  . hydrochlorothiazide (MICROZIDE) 12.5 MG capsule Take 1 capsule (12.5 mg total) by mouth daily. Annual appt due in Sept must see provider for future refills 90 capsule 0  . VITAMIN D PO Take 1 tablet by mouth daily.     No current facility-administered medications on file prior to visit.    Past Medical History:  Diagnosis Date  . Fatigue   . Headache   .  Hypertension   . Hypertension   . Memory loss   . Muscle ache of extremity    left arm  . Neck pain    mild    Past Surgical History:  Procedure Laterality Date  . no surgery      Social History   Socioeconomic History  . Marital status: Married    Spouse name: Not on file  . Number of children: 2  . Years of education: middle school  . Highest education level: Not on file  Occupational History  . Occupation: Retired  Tobacco Use  . Smoking status: Never Smoker  . Smokeless tobacco: Never Used  Vaping Use  . Vaping Use: Never used  Substance and Sexual Activity  . Alcohol use: Yes    Comment: very rare  . Drug use: No  . Sexual activity: Not on file  Other Topics Concern  . Not on file  Social History Narrative   Lives at home with her husband.   Right-handed.   One cup caffeine per day.   Social Determinants of Health   Financial Resource Strain:   . Difficulty of Paying Living Expenses: Not on file  Food Insecurity:   . Worried About Programme researcher, broadcasting/film/video in the Last Year: Not on file  . Ran Out of Food in the Last Year: Not on file  Transportation Needs:   .  Lack of Transportation (Medical): Not on file  . Lack of Transportation (Non-Medical): Not on file  Physical Activity:   . Days of Exercise per Week: Not on file  . Minutes of Exercise per Session: Not on file  Stress:   . Feeling of Stress : Not on file  Social Connections:   . Frequency of Communication with Friends and Family: Not on file  . Frequency of Social Gatherings with Friends and Family: Not on file  . Attends Religious Services: Not on file  . Active Member of Clubs or Organizations: Not on file  . Attends Banker Meetings: Not on file  . Marital Status: Not on file    Family History  Problem Relation Age of Onset  . Diabetes Mother   . Heart disease Mother   . Stroke Mother   . Cancer Father        unsure of type    Review of Systems  Constitutional: Negative  for chills and fever.  Eyes: Negative for visual disturbance.  Respiratory: Negative for cough, shortness of breath and wheezing.   Cardiovascular: Positive for palpitations (with anxiety/stress). Negative for chest pain and leg swelling.  Gastrointestinal: Negative for abdominal pain, blood in stool, constipation, diarrhea and nausea.       Occ GERD  Genitourinary: Negative for dysuria and hematuria.  Musculoskeletal: Positive for arthralgias (knees).  Skin: Negative for rash.  Neurological: Negative for dizziness, light-headedness and headaches.  Psychiatric/Behavioral: The patient is nervous/anxious (just occ).        Objective:   Vitals:   09/25/20 0808  BP: 132/68  Pulse: 60  Temp: 98.4 F (36.9 C)  SpO2: 97%   Filed Weights   09/25/20 0808  Weight: 135 lb 6.4 oz (61.4 kg)   Body mass index is 23.61 kg/m.  BP Readings from Last 3 Encounters:  09/25/20 132/68  07/07/20 120/68  09/25/19 132/72    Wt Readings from Last 3 Encounters:  09/25/20 135 lb 6.4 oz (61.4 kg)  07/07/20 135 lb (61.2 kg)  09/25/19 136 lb (61.7 kg)   Depression screen: Depression screen PHQ 2/9 09/25/2020  Decreased Interest 0  Down, Depressed, Hopeless 0  PHQ - 2 Score 0        Physical Exam Constitutional: She appears well-developed and well-nourished. No distress.  HENT:  Head: Normocephalic and atraumatic.  Right Ear: External ear normal. Normal ear canal and TM Left Ear: External ear normal.  Normal ear canal and TM Mouth/Throat: Oropharynx is clear and moist.  Eyes: Conjunctivae and EOM are normal.  Neck: Neck supple. No tracheal deviation present. No thyromegaly present.  No carotid bruit  Cardiovascular: Normal rate, regular rhythm and normal heart sounds.   No murmur heard.  No edema. Pulmonary/Chest: Effort normal and breath sounds normal. No respiratory distress. She has no wheezes. She has no rales.  Breast: deferred   Abdominal: Soft. She exhibits no distension.  There is no tenderness.  Lymphadenopathy: She has no cervical adenopathy.  Skin: Skin is warm and dry. She is not diaphoretic.  Psychiatric: She has a normal mood and affect. Her behavior is normal.        Assessment & Plan:   Physical exam: Screening blood work    ordered Immunizations  Flu vac today, tdap discussed, covid booster advised, shingrix - had a couple of years ago Colonoscopy  Last one was in [place] - ? results Mammogram  Up to date  Gyn  Up to date  Dexa  Up to date  Eye exams  Up to date  Exercise  walking Weight  good Substance abuse  None   Screened for depression using the PHQ 2 scale.  No evidence of depression.     See Problem List for Assessment and Plan of chronic medical problems.   This visit occurred during the SARS-CoV-2 public health emergency.  Safety protocols were in place, including screening questions prior to the visit, additional usage of staff PPE, and extensive cleaning of exam room while observing appropriate contact time as indicated for disinfecting solutions.

## 2020-09-24 NOTE — Patient Instructions (Addendum)
Calcium take 600 - 1000 mg a day   Blood work was ordered.    All other Health Maintenance issues reviewed.   All recommended immunizations and age-appropriate screenings are up-to-date or discussed.  Flu immunization administered today.    Medications reviewed and updated.  Changes include :   none  Your prescription(s) have been submitted to your pharmacy. Please take as directed and contact our office if you believe you are having problem(s) with the medication(s).  A referral was ordered for GI for a colonoscopy.   Someone will call you to schedule an appointment.    Please followup in 6 months    Health Maintenance, Female Adopting a healthy lifestyle and getting preventive care are important in promoting health and wellness. Ask your health care provider about:  The right schedule for you to have regular tests and exams.  Things you can do on your own to prevent diseases and keep yourself healthy. What should I know about diet, weight, and exercise? Eat a healthy diet   Eat a diet that includes plenty of vegetables, fruits, low-fat dairy products, and lean protein.  Do not eat a lot of foods that are high in solid fats, added sugars, or sodium. Maintain a healthy weight Body mass index (BMI) is used to identify weight problems. It estimates body fat based on height and weight. Your health care provider can help determine your BMI and help you achieve or maintain a healthy weight. Get regular exercise Get regular exercise. This is one of the most important things you can do for your health. Most adults should:  Exercise for at least 150 minutes each week. The exercise should increase your heart rate and make you sweat (moderate-intensity exercise).  Do strengthening exercises at least twice a week. This is in addition to the moderate-intensity exercise.  Spend less time sitting. Even light physical activity can be beneficial. Watch cholesterol and blood lipids Have  your blood tested for lipids and cholesterol at 67 years of age, then have this test every 5 years. Have your cholesterol levels checked more often if:  Your lipid or cholesterol levels are high.  You are older than 67 years of age.  You are at high risk for heart disease. What should I know about cancer screening? Depending on your health history and family history, you may need to have cancer screening at various ages. This may include screening for:  Breast cancer.  Cervical cancer.  Colorectal cancer.  Skin cancer.  Lung cancer. What should I know about heart disease, diabetes, and high blood pressure? Blood pressure and heart disease  High blood pressure causes heart disease and increases the risk of stroke. This is more likely to develop in people who have high blood pressure readings, are of African descent, or are overweight.  Have your blood pressure checked: ? Every 3-5 years if you are 35-37 years of age. ? Every year if you are 25 years old or older. Diabetes Have regular diabetes screenings. This checks your fasting blood sugar level. Have the screening done:  Once every three years after age 75 if you are at a normal weight and have a low risk for diabetes.  More often and at a younger age if you are overweight or have a high risk for diabetes. What should I know about preventing infection? Hepatitis B If you have a higher risk for hepatitis B, you should be screened for this virus. Talk with your health care provider to find  out if you are at risk for hepatitis B infection. Hepatitis C Testing is recommended for:  Everyone born from [birth year] through 1963.  Anyone with known risk factors for hepatitis C. Sexually transmitted infections (STIs)  Get screened for STIs, including gonorrhea and chlamydia, if: ? You are sexually active and are younger than 67 years of age. ? You are older than 67 years of age and your health care provider tells you that you are at  risk for this type of infection. ? Your sexual activity has changed since you were last screened, and you are at increased risk for chlamydia or gonorrhea. Ask your health care provider if you are at risk.  Ask your health care provider about whether you are at high risk for HIV. Your health care provider may recommend a prescription medicine to help prevent HIV infection. If you choose to take medicine to prevent HIV, you should first get tested for HIV. You should then be tested every 3 months for as long as you are taking the medicine. Pregnancy  If you are about to stop having your period (premenopausal) and you may become pregnant, seek counseling before you get pregnant.  Take 400 to 800 micrograms (mcg) of folic acid every day if you become pregnant.  Ask for birth control (contraception) if you want to prevent pregnancy. Osteoporosis and menopause Osteoporosis is a disease in which the bones lose minerals and strength with aging. This can result in bone fractures. If you are 10 years old or older, or if you are at risk for osteoporosis and fractures, ask your health care provider if you should:  Be screened for bone loss.  Take a calcium or vitamin D supplement to lower your risk of fractures.  Be given hormone replacement therapy (HRT) to treat symptoms of menopause. Follow these instructions at home: Lifestyle  Do not use any products that contain nicotine or tobacco, such as cigarettes, e-cigarettes, and chewing tobacco. If you need help quitting, ask your health care provider.  Do not use street drugs.  Do not share needles.  Ask your health care provider for help if you need support or information about quitting drugs. Alcohol use  Do not drink alcohol if: ? Your health care provider tells you not to drink. ? You are pregnant, may be pregnant, or are planning to become pregnant.  If you drink alcohol: ? Limit how much you use to 0-1 drink a day. ? Limit intake if you  are breastfeeding.  Be aware of how much alcohol is in your drink. In the [place] one drink equals one 12 oz bottle of beer (355 mL), one 5 oz glass of wine (148 mL), or one 1 oz glass of hard liquor (44 mL). General instructions  Schedule regular health, dental, and eye exams.  Stay current with your vaccines.  Tell your health care provider if: ? You often feel depressed. ? You have ever been abused or do not feel safe at home. Summary  Adopting a healthy lifestyle and getting preventive care are important in promoting health and wellness.  Follow your health care provider's instructions about healthy diet, exercising, and getting tested or screened for diseases.  Follow your health care provider's instructions on monitoring your cholesterol and blood pressure. This information is not intended to replace advice given to you by your health care provider. Make sure you discuss any questions you have with your health care provider. Document Revised: 12/05/2018 Document Reviewed: 12/05/2018 Elsevier Patient Education  [employer].

## 2020-09-25 ENCOUNTER — Encounter: Payer: Self-pay | Admitting: Internal Medicine

## 2020-09-25 ENCOUNTER — Ambulatory Visit (INDEPENDENT_AMBULATORY_CARE_PROVIDER_SITE_OTHER): Payer: Medicare Other | Admitting: Internal Medicine

## 2020-09-25 ENCOUNTER — Other Ambulatory Visit: Payer: Self-pay

## 2020-09-25 VITALS — BP 132/68 | HR 60 | Temp 98.4°F | Ht 63.5 in | Wt 135.4 lb

## 2020-09-25 DIAGNOSIS — E559 Vitamin D deficiency, unspecified: Secondary | ICD-10-CM

## 2020-09-25 DIAGNOSIS — K5900 Constipation, unspecified: Secondary | ICD-10-CM

## 2020-09-25 DIAGNOSIS — Z23 Encounter for immunization: Secondary | ICD-10-CM | POA: Diagnosis not present

## 2020-09-25 DIAGNOSIS — I10 Essential (primary) hypertension: Secondary | ICD-10-CM | POA: Diagnosis not present

## 2020-09-25 DIAGNOSIS — R7303 Prediabetes: Secondary | ICD-10-CM | POA: Diagnosis not present

## 2020-09-25 DIAGNOSIS — Z1211 Encounter for screening for malignant neoplasm of colon: Secondary | ICD-10-CM

## 2020-09-25 DIAGNOSIS — E7849 Other hyperlipidemia: Secondary | ICD-10-CM | POA: Diagnosis not present

## 2020-09-25 DIAGNOSIS — M85852 Other specified disorders of bone density and structure, left thigh: Secondary | ICD-10-CM

## 2020-09-25 DIAGNOSIS — Z0000 Encounter for general adult medical examination without abnormal findings: Secondary | ICD-10-CM

## 2020-09-25 DIAGNOSIS — M85851 Other specified disorders of bone density and structure, right thigh: Secondary | ICD-10-CM

## 2020-09-25 NOTE — Assessment & Plan Note (Signed)
Chronic Check lipid panel  Continue atorvastatin 20 mg daily Regular exercise and healthy diet encouraged  

## 2020-09-25 NOTE — Assessment & Plan Note (Signed)
Chronic Taking MVI high in vitamin d Will check level

## 2020-09-25 NOTE — Assessment & Plan Note (Signed)
Chronic BP well controlled Continue hctz 12.5 mg daily, amlodipine 2.5 mg daily cmp

## 2020-09-25 NOTE — Assessment & Plan Note (Signed)
Chronic dexa up to date She is walking Taking vitamin d, MVI

## 2020-09-25 NOTE — Assessment & Plan Note (Addendum)
Chronic Sugars a little high at home  Check a1c Low sugar / carb diet Stressed regular exercise

## 2020-09-25 NOTE — Assessment & Plan Note (Signed)
Chronic Occasional Overall controlled

## 2020-09-26 LAB — COMPREHENSIVE METABOLIC PANEL
AG Ratio: 1.8 (calc) (ref 1.0–2.5)
ALT: 21 U/L (ref 6–29)
AST: 17 U/L (ref 10–35)
Albumin: 4.6 g/dL (ref 3.6–5.1)
Alkaline phosphatase (APISO): 86 U/L (ref 37–153)
BUN/Creatinine Ratio: 42 (calc) — ABNORMAL HIGH (ref 6–22)
BUN: 20 mg/dL (ref 7–25)
CO2: 28 mmol/L (ref 20–32)
Calcium: 9.7 mg/dL (ref 8.6–10.4)
Chloride: 103 mmol/L (ref 98–110)
Creat: 0.48 mg/dL — ABNORMAL LOW (ref 0.50–0.99)
Globulin: 2.5 g/dL (calc) (ref 1.9–3.7)
Glucose, Bld: 96 mg/dL (ref 65–99)
Potassium: 3.8 mmol/L (ref 3.5–5.3)
Sodium: 140 mmol/L (ref 135–146)
Total Bilirubin: 0.9 mg/dL (ref 0.2–1.2)
Total Protein: 7.1 g/dL (ref 6.1–8.1)

## 2020-09-26 LAB — TSH: TSH: 1.61 mIU/L (ref 0.40–4.50)

## 2020-09-26 LAB — HEMOGLOBIN A1C
Hgb A1c MFr Bld: 5.8 % of total Hgb — ABNORMAL HIGH (ref <5.7)
Mean Plasma Glucose: 120 (calc)
eAG (mmol/L): 6.6 (calc)

## 2020-09-26 LAB — CBC WITH DIFFERENTIAL/PLATELET
Absolute Monocytes: 331 cells/uL (ref 200–950)
Basophils Absolute: 21 cells/uL (ref 0–200)
Basophils Relative: 0.3 %
Eosinophils Absolute: 62 cells/uL (ref 15–500)
Eosinophils Relative: 0.9 %
HCT: 41.2 % (ref 35.0–45.0)
Hemoglobin: 14.1 g/dL (ref 11.7–15.5)
Lymphs Abs: 1622 cells/uL (ref 850–3900)
MCH: 31 pg (ref 27.0–33.0)
MCHC: 34.2 g/dL (ref 32.0–36.0)
MCV: 90.5 fL (ref 80.0–100.0)
MPV: 10.7 fL (ref 7.5–12.5)
Monocytes Relative: 4.8 %
Neutro Abs: 4865 cells/uL (ref 1500–7800)
Neutrophils Relative %: 70.5 %
Platelets: 212 10*3/uL (ref 140–400)
RBC: 4.55 10*6/uL (ref 3.80–5.10)
RDW: 12 % (ref 11.0–15.0)
Total Lymphocyte: 23.5 %
WBC: 6.9 10*3/uL (ref 3.8–10.8)

## 2020-09-26 LAB — LIPID PANEL
Cholesterol: 129 mg/dL (ref <200)
HDL: 51 mg/dL (ref >=50)
LDL Cholesterol (Calc): 62 mg/dL (calc)
Non-HDL Cholesterol (Calc): 78 mg/dL (calc) (ref <130)
Total CHOL/HDL Ratio: 2.5 (calc) (ref <5.0)
Triglycerides: 79 mg/dL (ref <150)

## 2020-09-26 LAB — VITAMIN D 25 HYDROXY (VIT D DEFICIENCY, FRACTURES): Vit D, 25-Hydroxy: 27 ng/mL — ABNORMAL LOW (ref 30–100)

## 2020-10-02 ENCOUNTER — Telehealth: Payer: Self-pay | Admitting: Internal Medicine

## 2020-10-02 NOTE — Telephone Encounter (Signed)
Daughter Jo Jenkins is calling and requesting a nurse to call her back and go over mom's medication list. They are unclear what she needs to be taking.   [phone] - Sophia

## 2020-10-06 NOTE — Telephone Encounter (Signed)
Called Sophia back but I am unable to leave a message because her voicemail has not been set up yet.

## 2020-10-12 NOTE — Telephone Encounter (Signed)
Second message left for pharmacy today.

## 2020-10-27 ENCOUNTER — Other Ambulatory Visit: Payer: Self-pay

## 2020-10-27 ENCOUNTER — Telehealth: Payer: Self-pay | Admitting: Internal Medicine

## 2020-10-27 MED ORDER — ATORVASTATIN CALCIUM 20 MG PO TABS
ORAL_TABLET | ORAL | 0 refills | Status: DC
Start: 2020-10-27 — End: 2020-10-27

## 2020-10-27 MED ORDER — ATORVASTATIN CALCIUM 20 MG PO TABS
ORAL_TABLET | ORAL | 0 refills | Status: DC
Start: 2020-10-27 — End: 2020-12-28

## 2020-10-27 MED ORDER — ATORVASTATIN CALCIUM 20 MG PO TABS: ORAL_TABLET | ORAL | 0 refills | Status: DC

## 2020-10-27 NOTE — Telephone Encounter (Signed)
Patient is requesting a med refill for atorvastatin (LIPITOR) 20 MG tablet  hydrochlorothiazide (MICROZIDE) 12.5 MG capsule   It can be sent to [id number] - [place], [place] - [address]

## 2020-10-27 NOTE — Telephone Encounter (Signed)
ERROR. meds can be sent to [facility] [address] [facility], [place] [address]

## 2020-10-27 NOTE — Telephone Encounter (Signed)
Sent in today for patient to requested pharmacy.

## 2020-11-13 ENCOUNTER — Other Ambulatory Visit: Payer: Self-pay

## 2020-11-13 ENCOUNTER — Telehealth: Payer: Self-pay | Admitting: Internal Medicine

## 2020-11-13 MED ORDER — AMLODIPINE BESYLATE 2.5 MG PO TABS
ORAL_TABLET | ORAL | 1 refills | Status: DC
Start: 2020-11-13 — End: 2021-05-14

## 2020-11-13 MED ORDER — HYDROCHLOROTHIAZIDE 12.5 MG PO CAPS
12.5000 mg | ORAL_CAPSULE | Freq: Every day | ORAL | 1 refills | Status: DC
Start: 2020-11-13 — End: 2020-11-17

## 2020-11-13 NOTE — Telephone Encounter (Signed)
   1.Medication Requested: hydrochlorothiazide (MICROZIDE) 12.5 MG capsule  2. Pharmacy (Name, Street, [place]):[facility] [phone] - [place], [place] - [address]. On Med List: yes  4. Last Visit with PCP:09/25/20  5. Next visit date with PCP: n/a   Agent: Please be advised that RX refills may take up to 3 business days. We ask that you follow-up with your pharmacy.

## 2020-11-13 NOTE — Telephone Encounter (Signed)
Faxed in today. 

## 2020-11-16 NOTE — Telephone Encounter (Signed)
   Please resend hydrochlorothiazide to [facility]

## 2020-11-17 ENCOUNTER — Other Ambulatory Visit: Payer: Self-pay

## 2020-11-17 MED ORDER — HYDROCHLOROTHIAZIDE 12.5 MG PO CAPS
12.5000 mg | ORAL_CAPSULE | Freq: Every day | ORAL | 1 refills | Status: DC
Start: 2020-11-17 — End: 2020-11-17

## 2020-11-17 MED ORDER — HYDROCHLOROTHIAZIDE 12.5 MG PO CAPS
12.5000 mg | ORAL_CAPSULE | Freq: Every day | ORAL | 1 refills | Status: DC
Start: 2020-11-17 — End: 2020-12-28

## 2020-11-17 NOTE — Telephone Encounter (Signed)
Sent in today 

## 2020-12-28 ENCOUNTER — Telehealth: Payer: Self-pay | Admitting: Internal Medicine

## 2020-12-28 ENCOUNTER — Other Ambulatory Visit: Payer: Self-pay

## 2020-12-28 MED ORDER — HYDROCHLOROTHIAZIDE 12.5 MG PO CAPS
12.5000 mg | ORAL_CAPSULE | Freq: Every day | ORAL | 1 refills | Status: DC
Start: 2020-12-28 — End: 2021-07-09

## 2020-12-28 MED ORDER — ATORVASTATIN CALCIUM 20 MG PO TABS
ORAL_TABLET | ORAL | 0 refills | Status: DC
Start: 2020-12-28 — End: 2020-12-31

## 2020-12-28 MED ORDER — CLOBETASOL PROPIONATE 0.05 % EX OINT: 1.0000 "application " | TOPICAL_OINTMENT | Freq: Two times a day (BID) | CUTANEOUS | 0 refills | Status: DC

## 2020-12-28 NOTE — Telephone Encounter (Signed)
Sent in today 

## 2020-12-28 NOTE — Telephone Encounter (Signed)
   Patient's daughter calling, states patient forgot medication in [place] Requesting refill for atorvastatin (LIPITOR) 20 MG tablet clobetasol ointment (TEMOVATE) 0.05 % hydrochlorothiazide (MICROZIDE) 12.5 MG capsule  [id number] [place] [place] - [address]  Patient can be reaches at [phone]

## 2020-12-31 MED ORDER — ATORVASTATIN CALCIUM 20 MG PO TABS
ORAL_TABLET | ORAL | 0 refills | Status: DC
Start: 2020-12-31 — End: 2021-04-02

## 2020-12-31 NOTE — Telephone Encounter (Signed)
atorvastatin (LIPITOR) 20 MG tablet 90 tablet 0 12/28/2020    Sig: TAKE ONE TABLET BY MOUTH ONE TIME DAILY   Sent to pharmacy as: atorvastatin (LIPITOR) 20 MG tablet   E-Prescribing Status: Receipt confirmed by pharmacy (12/28/2020 4:05 PM EST)

## 2020-12-31 NOTE — Telephone Encounter (Signed)
  Daughter requesting atorvastatin (LIPITOR) 20 MG tablet be resent to pharmacy She states they did not have request to refill

## 2020-12-31 NOTE — Addendum Note (Signed)
Addended by: Karma Ganja on: 12/31/2020 04:48 PM   Modules accepted: Orders

## 2021-01-25 ENCOUNTER — Telehealth: Payer: Self-pay | Admitting: Internal Medicine

## 2021-01-25 NOTE — Telephone Encounter (Signed)
[employer] FYI  Daughter served as interpreter for her mother. Caller states she tested positive for COVID yesterday. States she has a dry frequent cough, sore throat, body aches. No headache. No fever. No SOB. Taking Tylenol.   Advised Home care.

## 2021-04-02 ENCOUNTER — Other Ambulatory Visit: Payer: Self-pay | Admitting: Internal Medicine

## 2021-05-13 NOTE — Patient Instructions (Addendum)
  Blood work was ordered.      Your BP should be on average less than 140/90.  Monitor your BP at home and bring your BP cuff and BP readings to your next appointment.   Medications changes include :   Losartan 25 mg daily for your BP  Your prescription(s) have been submitted to your pharmacy. Please take as directed and contact our office if you believe you are having problem(s) with the medication(s).   A referral was ordered for GI.      Someone from their office will call you to schedule an appointment.    Please followup in 2 months

## 2021-05-13 NOTE — Progress Notes (Signed)
Subjective:    Patient ID: Jo Jenkins, female    DOB: 12-16-1951, 70 y.o.   MRN: [id number]  HPI The patient is here for follow up of their chronic medical problems, including htn, prediabetes, hyperlipidemia.  She is here with her daughter.  Her daughter helps interpret.  BP at home in the morning - 150's and often high in the afternoon.    She had covid earlier this year.  She feel tired.  She feels out of it.  This likely started after covid.    She is taking not vitamin c and d consistently.  Her daughter just ordered her a new vitamin that supposed to help with post COVID that contains vitamin C, vitamin D, zinc, yeast    Medications and allergies reviewed with patient and updated if appropriate.  Patient Active Problem List   Diagnosis Date Noted  . Vitamin D deficiency 09/24/2019  . Small vessel disease, cerebrovascular 03/05/2019  . Memory loss 01/03/2019  . Deficit in comprehension 01/03/2019  . Headache disorder 09/05/2018  . Fatigue 09/05/2018  . Anxiety and depression 09/05/2018  . Concussion with no loss of consciousness 01/03/2018  . Osteopenia 11/23/2017  . Prediabetes 11/20/2017  . Hyperlipidemia 11/20/2017  . Anal skin tag 11/20/2017  . Constipation 11/20/2017  . Essential hypertension, benign 12/24/2015  . Right hip pain 12/24/2015  . 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 atorvastatin (LIPITOR) 20 MG tablet TAKE 1 TABLET BY MOUTH EVERY DAY 90 tablet 0  . hydrochlorothiazide (MICROZIDE) 12.5 MG capsule Take 1 capsule (12.5 mg total) by mouth daily. 90 capsule 1  . VITAMIN D PO Take 1 tablet by mouth daily.     No current facility-administered medications on file prior to visit.    Past Medical History:  Diagnosis Date  . Fatigue   . Headache   . Hypertension   . Hypertension   . Memory loss   . Muscle ache of extremity    left  arm  . Neck pain    mild    Past Surgical History:  Procedure Laterality Date  . no surgery      Social History   Socioeconomic History  . Marital status: Married    Spouse name: Not on file  . Number of children: 2  . Years of education: middle school  . Highest education level: Not on file  Occupational History  . Occupation: Retired  Tobacco Use  . Smoking status: Never Smoker  . Smokeless tobacco: Never Used  Vaping Use  . Vaping Use: Never used  Substance and Sexual Activity  . Alcohol use: Yes    Comment: very rare  . Drug use: No  . Sexual activity: Not on file  Other Topics Concern  . Not on file  Social History Narrative   Lives at home with her husband.   Right-handed.   One cup caffeine per day.   Social Determinants of Health   Financial Resource Strain: Not on file  Food Insecurity: Not on file  Transportation Needs: Not on file  Physical Activity: Not on file  Stress: Not on file  Social Connections: Not on file    Family History  Problem Relation Age of Onset  . Diabetes Mother   . Heart disease Mother   . Stroke Mother   . Cancer Father  unsure of type    Review of Systems  Constitutional: Positive for fatigue. Negative for chills and fever.  Respiratory: Negative for cough, shortness of breath (not sure) and wheezing.   Cardiovascular: Positive for palpitations (with stress). Negative for chest pain and leg swelling.  Gastrointestinal: Negative for abdominal pain and nausea.       GERD - takes otc prn  Neurological: Positive for tremors (head intermittent) and light-headedness (occ). Negative for headaches.       Objective:   Vitals:   05/14/21 1522  BP: 132/78  Pulse: 60  Temp: 98 F (36.7 C)  SpO2: 97%   BP Readings from Last 3 Encounters:  05/14/21 132/78  09/25/20 132/68  07/07/20 120/68   Wt Readings from Last 3 Encounters:  05/14/21 135 lb (61.2 kg)  09/25/20 135 lb 6.4 oz (61.4 kg)  07/07/20 135 lb (61.2  kg)   Body mass index is 23.54 kg/m.   Physical Exam    Constitutional: Appears well-developed and well-nourished. No distress.  HENT:  Head: Normocephalic and atraumatic.  Neck: Neck supple. No tracheal deviation present. No thyromegaly present.  No cervical lymphadenopathy Cardiovascular: Normal rate, regular rhythm and normal heart sounds.   No murmur heard. No carotid bruit .  Trace bilateral lower extremity edema Pulmonary/Chest: Effort normal and breath sounds normal. No respiratory distress. No has no wheezes. No rales.  Skin: Skin is warm and dry. Not diaphoretic.  Psychiatric: Normal mood and affect. Behavior is normal.      Assessment & Plan:    See Problem List for Assessment and Plan of chronic medical problems.    This visit occurred during the SARS-CoV-2 public health emergency.  Safety protocols were in place, including screening questions prior to the visit, additional usage of staff PPE, and extensive cleaning of exam room while observing appropriate contact time as indicated for disinfecting solutions.

## 2021-05-14 ENCOUNTER — Encounter: Payer: Self-pay | Admitting: Internal Medicine

## 2021-05-14 ENCOUNTER — Other Ambulatory Visit: Payer: Self-pay

## 2021-05-14 ENCOUNTER — Ambulatory Visit (INDEPENDENT_AMBULATORY_CARE_PROVIDER_SITE_OTHER): Payer: Medicare Other | Admitting: Internal Medicine

## 2021-05-14 VITALS — BP 132/78 | HR 60 | Temp 98.0°F | Ht 63.5 in | Wt 135.0 lb

## 2021-05-14 DIAGNOSIS — R5383 Other fatigue: Secondary | ICD-10-CM

## 2021-05-14 DIAGNOSIS — I10 Essential (primary) hypertension: Secondary | ICD-10-CM | POA: Diagnosis not present

## 2021-05-14 DIAGNOSIS — K219 Gastro-esophageal reflux disease without esophagitis: Secondary | ICD-10-CM

## 2021-05-14 DIAGNOSIS — E7849 Other hyperlipidemia: Secondary | ICD-10-CM

## 2021-05-14 DIAGNOSIS — R7303 Prediabetes: Secondary | ICD-10-CM | POA: Diagnosis not present

## 2021-05-14 LAB — CBC WITH DIFFERENTIAL/PLATELET
Basophils Absolute: 0 10*3/uL (ref 0.0–0.1)
Basophils Relative: 0.6 % (ref 0.0–3.0)
Eosinophils Absolute: 0.2 10*3/uL (ref 0.0–0.7)
Eosinophils Relative: 2.6 % (ref 0.0–5.0)
HCT: 38.4 % (ref 36.0–46.0)
Hemoglobin: 13.2 g/dL (ref 12.0–15.0)
Lymphocytes Relative: 43.1 % (ref 12.0–46.0)
Lymphs Abs: 2.6 10*3/uL (ref 0.7–4.0)
MCHC: 34.2 g/dL (ref 30.0–36.0)
MCV: 89.6 fl (ref 78.0–100.0)
Monocytes Absolute: 0.3 10*3/uL (ref 0.1–1.0)
Monocytes Relative: 4.5 % (ref 3.0–12.0)
Neutro Abs: 2.9 10*3/uL (ref 1.4–7.7)
Neutrophils Relative %: 49.2 % (ref 43.0–77.0)
Platelets: 186 10*3/uL (ref 150.0–400.0)
RBC: 4.29 Mil/uL (ref 3.87–5.11)
RDW: 12.5 % (ref 11.5–15.5)
WBC: 6 10*3/uL (ref 4.0–10.5)

## 2021-05-14 LAB — HEMOGLOBIN A1C: Hgb A1c MFr Bld: 5.8 % (ref 4.6–6.5)

## 2021-05-14 LAB — LIPID PANEL
Cholesterol: 154 mg/dL (ref 0–200)
HDL: 53.3 mg/dL (ref >39.00)
LDL Cholesterol: 76 mg/dL (ref 0–99)
NonHDL: 100.79
Total CHOL/HDL Ratio: 3
Triglycerides: 123 mg/dL (ref 0.0–149.0)
VLDL: 24.6 mg/dL (ref 0.0–40.0)

## 2021-05-14 LAB — COMPREHENSIVE METABOLIC PANEL
ALT: 16 U/L (ref 0–35)
AST: 16 U/L (ref 0–37)
Albumin: 4.5 g/dL (ref 3.5–5.2)
Alkaline Phosphatase: 78 U/L (ref 39–117)
BUN: 13 mg/dL (ref 6–23)
CO2: 30 mEq/L (ref 19–32)
Calcium: 9.3 mg/dL (ref 8.4–10.5)
Chloride: 100 mEq/L (ref 96–112)
Creatinine, Ser: 0.54 mg/dL (ref 0.40–1.20)
GFR: 95.25 mL/min (ref >60.00)
Glucose, Bld: 94 mg/dL (ref 70–99)
Potassium: 3.7 mEq/L (ref 3.5–5.1)
Sodium: 137 mEq/L (ref 135–145)
Total Bilirubin: 0.5 mg/dL (ref 0.2–1.2)
Total Protein: 7 g/dL (ref 6.0–8.3)

## 2021-05-14 LAB — TSH: TSH: 2.34 u[IU]/mL (ref 0.35–4.50)

## 2021-05-14 MED ORDER — LOSARTAN POTASSIUM 25 MG PO TABS: 25.0000 mg | ORAL_TABLET | Freq: Every day | ORAL | 5 refills | Status: DC

## 2021-05-14 NOTE — Assessment & Plan Note (Addendum)
New Started after covid ? Related to covid or not Will check cbc, cmp, tsh to r/o other causes Will start daily vitamin

## 2021-05-14 NOTE — Assessment & Plan Note (Addendum)
Chronic ?  Controlled Blood pressure looks good here, but seems to be consistently high at home Will add losartan 25 mg daily, continue hydrochlorothiazide 12.5 mg daily Advised her to continue to monitor at home and continue to check at different times of day-bring blood pressure cuff and blood pressure log to her next appointment CBC, CMP Follow-up in 2 months

## 2021-05-14 NOTE — Assessment & Plan Note (Signed)
Chronic Continue atorvastatin 20 mg daily Check lipid panel, TSH, CMP

## 2021-05-14 NOTE — Assessment & Plan Note (Signed)
Neck Intermittent GERD Her daughter states this is often related to when she is stressed Takes over-the-counter medication and that does help Her daughter is very concerned about her having H. pylori-states in [place] they do endoscopies yearly and she wonders if her mother needs that She deferred any treatment for anxiety/stress Refer to GI to discuss with them.  Also looks like she may be due for colonoscopy

## 2021-05-14 NOTE — Assessment & Plan Note (Signed)
Chronic Check a1c Low sugar / carb diet Stressed regular exercise  

## 2021-07-09 ENCOUNTER — Other Ambulatory Visit: Payer: Self-pay | Admitting: Internal Medicine

## 2021-07-13 ENCOUNTER — Other Ambulatory Visit: Payer: Self-pay | Admitting: Internal Medicine

## 2021-07-22 NOTE — Progress Notes (Signed)
Subjective:    Patient ID: Jo Jenkins, female    DOB: 30-Aug-1951, 70 y.o.   MRN: [id number]  HPI The patient is here for follow up of their chronic medical problems, including htn, hichol, prediabetes  Started on losartan 2 months ago.  She is taking all of her medications as prescribed.    BP at home well controlled.  Sugars are also well controlled at home.  She is thinking about taking FMLA for fatigue.  The fatigue started after COVID and vitamin/supplements are not helping.  She wants to take 3-4 months off and live with family to do natural things to help with her fatigue.  She may want to start doing acupuncture.  She is concerned about losing her seniority more than getting paid.  She is worried her head tremor is signs of a stroke in the future.    She has been constipated  - has tried everything.    Medications and allergies reviewed with patient and updated if appropriate.  Patient Active Problem List   Diagnosis Date Noted   Vitamin D deficiency 09/24/2019   Small vessel disease, cerebrovascular 03/05/2019   Memory loss 01/03/2019   Deficit in comprehension 01/03/2019   Headache disorder 09/05/2018   Fatigue 09/05/2018   Anxiety and depression 09/05/2018   Concussion with no loss of consciousness 01/03/2018   Osteopenia 11/23/2017   Prediabetes 11/20/2017   Hyperlipidemia 11/20/2017   Anal skin tag 11/20/2017   Constipation 11/20/2017   Gastroesophageal reflux disease without esophagitis 11/16/2016   Essential hypertension, benign 12/24/2015   Right hip pain 12/24/2015   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     No current facility-administered medications on file prior to visit.    Past Medical  History:  Diagnosis Date   Fatigue    Headache    Hypertension    Hypertension    Memory loss    Muscle ache of extremity    left arm   Neck pain    mild    Past Surgical History:  Procedure Laterality Date   no surgery      Social History   Socioeconomic History   Marital status: Married    Spouse name: Not on file   Number of children: 2   Years of education: middle school   Highest education level: Not on file  Occupational History   Occupation: Retired  Tobacco Use   Smoking status: Never   Smokeless tobacco: Never  Vaping Use   Vaping Use: Never used  Substance and Sexual Activity   Alcohol use: Yes    Comment: very rare   Drug use: No   Sexual activity: Not on file  Other Topics Concern   Not on file  Social History Narrative   Lives at home with her husband.   Right-handed.   One cup caffeine per day.   Social Determinants of Health   Financial Resource Strain: Not on file  Food Insecurity: Not on file  Transportation Needs: Not on file  Physical Activity: Not on file  Stress: Not on file  Social Connections: Not on file    Family History  Problem Relation Age of Onset   Diabetes Mother  Heart disease Mother    Stroke Mother    Cancer Father        unsure of type    Review of Systems  Constitutional:  Positive for fatigue. Negative for fever.  Respiratory:  Negative for shortness of breath.   Cardiovascular:  Negative for chest pain, palpitations and leg swelling.  Gastrointestinal:  Positive for constipation. Negative for abdominal pain and blood in stool.  Neurological:  Negative for light-headedness and headaches.      Objective:   Vitals:   07/23/21 1547  BP: 140/78  Pulse: (!) 59  Temp: 98.4 F (36.9 C)  SpO2: 98%   BP Readings from Last 3 Encounters:  07/23/21 140/78  05/14/21 132/78  09/25/20 132/68   Wt Readings from Last 3 Encounters:  07/23/21 138 lb 3.2 oz (62.7 kg)  05/14/21 135 lb (61.2 kg)  09/25/20 135  lb 6.4 oz (61.4 kg)   Body mass index is 24.1 kg/m.   Physical Exam    Constitutional: Appears well-developed and well-nourished. No distress.  HENT:  Head: Normocephalic and atraumatic.  Neck: Neck supple. No tracheal deviation present. No thyromegaly present.  No cervical lymphadenopathy Cardiovascular: Normal rate, regular rhythm and normal heart sounds.   No murmur heard. No carotid bruit .  No edema Pulmonary/Chest: Effort normal and breath sounds normal. No respiratory distress. No has no wheezes. No rales.  Skin: Skin is warm and dry. Not diaphoretic.  Psychiatric: Normal mood and affect. Behavior is normal.      Assessment & Plan:    See Problem List for Assessment and Plan of chronic medical problems.    This visit occurred during the SARS-CoV-2 public health emergency.  Safety protocols were in place, including screening questions prior to the visit, additional usage of staff PPE, and extensive cleaning of exam room while observing appropriate contact time as indicated for disinfecting solutions.

## 2021-07-23 ENCOUNTER — Encounter: Payer: Self-pay | Admitting: Internal Medicine

## 2021-07-23 ENCOUNTER — Other Ambulatory Visit: Payer: Self-pay

## 2021-07-23 ENCOUNTER — Ambulatory Visit (INDEPENDENT_AMBULATORY_CARE_PROVIDER_SITE_OTHER): Payer: Medicare Other | Admitting: Internal Medicine

## 2021-07-23 VITALS — BP 140/78 | HR 59 | Temp 98.4°F | Ht 63.5 in | Wt 138.2 lb

## 2021-07-23 DIAGNOSIS — K5900 Constipation, unspecified: Secondary | ICD-10-CM | POA: Diagnosis not present

## 2021-07-23 DIAGNOSIS — R7303 Prediabetes: Secondary | ICD-10-CM | POA: Diagnosis not present

## 2021-07-23 DIAGNOSIS — R5383 Other fatigue: Secondary | ICD-10-CM

## 2021-07-23 DIAGNOSIS — E7849 Other hyperlipidemia: Secondary | ICD-10-CM

## 2021-07-23 DIAGNOSIS — I10 Essential (primary) hypertension: Secondary | ICD-10-CM | POA: Diagnosis not present

## 2021-07-23 MED ORDER — LOSARTAN POTASSIUM 25 MG PO TABS: 25.0000 mg | ORAL_TABLET | Freq: Every day | ORAL | 1 refills | Status: DC

## 2021-07-23 MED ORDER — ATORVASTATIN CALCIUM 20 MG PO TABS: 20.0000 mg | ORAL_TABLET | Freq: Every day | ORAL | 3 refills | Status: DC

## 2021-07-23 NOTE — Patient Instructions (Addendum)
   Medications changes include :   none.  Try miralax for your constipation - you can take this daily.     Your prescription(s) have been submitted to your pharmacy. Please take as directed and contact our office if you believe you are having problem(s) with the medication(s).     Please followup in 6 months    Call [facility] to schedule an appointment for your colonoscopy -- Phone: [phone]

## 2021-07-23 NOTE — Assessment & Plan Note (Signed)
Chronic Lab Results  Component Value Date   HGBA1C 5.8 05/14/2021   Improved with lifestyle changes

## 2021-07-23 NOTE — Assessment & Plan Note (Signed)
Chronic BP well controlled at home, ok here today  Continue losartan 25 mg daily, hctz 12.5 mg cmp

## 2021-07-23 NOTE — Assessment & Plan Note (Signed)
Chronic Drinking plenty of water, exercises Has tried fiber, stool softener Try miralax daily

## 2021-07-23 NOTE — Assessment & Plan Note (Addendum)
Chronic Well controlled  Continue atorvastatin 20 mg daily Regular exercise and healthy diet encouraged

## 2021-07-24 NOTE — Assessment & Plan Note (Signed)
Chronic Seem to have started after COVID Vitamins and supplem supplements have not helped Blood work has been within normal limits Discussed the importance of healthy diet, regular exercise, good sleep and continuing the vitamins Unfortunately there is no other treatment that I can offer

## 2021-08-19 ENCOUNTER — Encounter: Payer: Self-pay | Admitting: Gastroenterology

## 2021-11-25 ENCOUNTER — Encounter: Payer: Self-pay | Admitting: Internal Medicine

## 2021-11-29 ENCOUNTER — Ambulatory Visit: Payer: Medicare Other | Admitting: Gastroenterology

## 2022-01-28 ENCOUNTER — Ambulatory Visit: Payer: Medicare Other | Admitting: Internal Medicine

## 2022-03-01 ENCOUNTER — Other Ambulatory Visit: Payer: Self-pay | Admitting: Internal Medicine

## 2022-03-12 ENCOUNTER — Other Ambulatory Visit: Payer: Self-pay | Admitting: Internal Medicine

## 2022-08-22 ENCOUNTER — Ambulatory Visit: Payer: Medicare Other | Admitting: Internal Medicine

## 2022-09-12 ENCOUNTER — Other Ambulatory Visit: Payer: Self-pay | Admitting: Internal Medicine

## 2022-10-11 ENCOUNTER — Other Ambulatory Visit: Payer: Self-pay | Admitting: Internal Medicine

## 2023-02-07 DIAGNOSIS — M5126 Other intervertebral disc displacement, lumbar region: Secondary | ICD-10-CM | POA: Diagnosis not present

## 2023-05-15 DIAGNOSIS — H524 Presbyopia: Secondary | ICD-10-CM | POA: Diagnosis not present

## 2023-06-22 DIAGNOSIS — E119 Type 2 diabetes mellitus without complications: Secondary | ICD-10-CM | POA: Diagnosis not present

## 2023-06-22 DIAGNOSIS — H903 Sensorineural hearing loss, bilateral: Secondary | ICD-10-CM | POA: Diagnosis not present

## 2023-06-22 DIAGNOSIS — Z77122 Contact with and (suspected) exposure to noise: Secondary | ICD-10-CM | POA: Diagnosis not present

## 2023-06-22 DIAGNOSIS — Z822 Family history of deafness and hearing loss: Secondary | ICD-10-CM | POA: Diagnosis not present

## 2023-07-14 DIAGNOSIS — M25551 Pain in right hip: Secondary | ICD-10-CM | POA: Diagnosis not present

## 2023-07-18 DIAGNOSIS — L818 Other specified disorders of pigmentation: Secondary | ICD-10-CM | POA: Diagnosis not present

## 2023-07-18 DIAGNOSIS — L728 Other follicular cysts of the skin and subcutaneous tissue: Secondary | ICD-10-CM | POA: Diagnosis not present

## 2023-07-18 DIAGNOSIS — L821 Other seborrheic keratosis: Secondary | ICD-10-CM | POA: Diagnosis not present

## 2023-08-14 DIAGNOSIS — R059 Cough, unspecified: Secondary | ICD-10-CM | POA: Diagnosis not present

## 2023-08-14 DIAGNOSIS — R5383 Other fatigue: Secondary | ICD-10-CM | POA: Diagnosis not present

## 2023-08-14 DIAGNOSIS — J029 Acute pharyngitis, unspecified: Secondary | ICD-10-CM | POA: Diagnosis not present

## 2023-08-14 DIAGNOSIS — U071 COVID-19: Secondary | ICD-10-CM | POA: Diagnosis not present

## 2023-08-14 DIAGNOSIS — R0981 Nasal congestion: Secondary | ICD-10-CM | POA: Diagnosis not present

## 2023-08-18 ENCOUNTER — Ambulatory Visit: Payer: Medicare Other | Admitting: Internal Medicine

## 2023-11-21 ENCOUNTER — Other Ambulatory Visit: Payer: Self-pay | Admitting: Internal Medicine

## 2024-02-16 DIAGNOSIS — R7303 Prediabetes: Secondary | ICD-10-CM | POA: Diagnosis not present

## 2024-02-16 DIAGNOSIS — Z131 Encounter for screening for diabetes mellitus: Secondary | ICD-10-CM | POA: Diagnosis not present

## 2024-02-16 DIAGNOSIS — H9190 Unspecified hearing loss, unspecified ear: Secondary | ICD-10-CM | POA: Diagnosis not present

## 2024-02-16 DIAGNOSIS — E559 Vitamin D deficiency, unspecified: Secondary | ICD-10-CM | POA: Diagnosis not present

## 2024-02-16 DIAGNOSIS — E785 Hyperlipidemia, unspecified: Secondary | ICD-10-CM | POA: Diagnosis not present

## 2024-02-16 DIAGNOSIS — I10 Essential (primary) hypertension: Secondary | ICD-10-CM | POA: Diagnosis not present

## 2024-02-16 DIAGNOSIS — E663 Overweight: Secondary | ICD-10-CM | POA: Diagnosis not present

## 2024-02-20 DIAGNOSIS — R7303 Prediabetes: Secondary | ICD-10-CM | POA: Diagnosis not present

## 2024-02-20 DIAGNOSIS — R319 Hematuria, unspecified: Secondary | ICD-10-CM | POA: Diagnosis not present

## 2024-02-20 DIAGNOSIS — I10 Essential (primary) hypertension: Secondary | ICD-10-CM | POA: Diagnosis not present

## 2024-02-20 DIAGNOSIS — G250 Essential tremor: Secondary | ICD-10-CM | POA: Diagnosis not present

## 2024-02-20 DIAGNOSIS — E785 Hyperlipidemia, unspecified: Secondary | ICD-10-CM | POA: Diagnosis not present
